# Patient Record
Sex: Female | Born: 1990 | Race: White | Hispanic: No | Marital: Single | Smoking: Current every day smoker
Health system: Southern US, Community
[De-identification: ages and names within clinical notes are randomized; demographics above are authoritative.]

## PROBLEM LIST (undated history)

## (undated) ENCOUNTER — Encounter

## (undated) ENCOUNTER — Ambulatory Visit

## (undated) ENCOUNTER — Encounter
Attending: Student in an Organized Health Care Education/Training Program | Primary: Student in an Organized Health Care Education/Training Program

## (undated) ENCOUNTER — Telehealth

## (undated) ENCOUNTER — Ambulatory Visit: Payer: PRIVATE HEALTH INSURANCE

## (undated) ENCOUNTER — Telehealth
Attending: Student in an Organized Health Care Education/Training Program | Primary: Student in an Organized Health Care Education/Training Program

## (undated) ENCOUNTER — Encounter: Attending: Radiation Oncology | Primary: Radiation Oncology

## (undated) ENCOUNTER — Non-Acute Institutional Stay: Payer: PRIVATE HEALTH INSURANCE

## (undated) ENCOUNTER — Ambulatory Visit: Payer: Medicaid (Managed Care)

## (undated) ENCOUNTER — Telehealth: Attending: Radiation Oncology | Primary: Radiation Oncology

## (undated) ENCOUNTER — Ambulatory Visit: Attending: Nurse Practitioner | Primary: Nurse Practitioner

## (undated) HISTORY — PX: BREAST ENHANCEMENT SURGERY: SHX7

---
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
## 2011-03-21 ENCOUNTER — Other Ambulatory Visit: Payer: Self-pay | Admitting: Nurse Practitioner

## 2011-03-21 ENCOUNTER — Other Ambulatory Visit (HOSPITAL_COMMUNITY)
Admission: RE | Admit: 2011-03-21 | Discharge: 2011-03-21 | Disposition: A | Discharge status: Home / Self Care | Payer: Medicaid Other | Source: Ambulatory Visit | Attending: Obstetrics and Gynecology | Admitting: Obstetrics and Gynecology

## 2011-03-21 DIAGNOSIS — Z113 Encounter for screening for infections with a predominantly sexual mode of transmission: Secondary | ICD-10-CM | POA: Insufficient documentation

## 2011-03-21 DIAGNOSIS — Z01419 Encounter for gynecological examination (general) (routine) without abnormal findings: Secondary | ICD-10-CM | POA: Insufficient documentation

## 2012-05-14 ENCOUNTER — Other Ambulatory Visit: Payer: Self-pay | Admitting: Obstetrics and Gynecology

## 2012-05-14 ENCOUNTER — Other Ambulatory Visit (HOSPITAL_COMMUNITY)
Admission: RE | Admit: 2012-05-14 | Discharge: 2012-05-14 | Disposition: A | Discharge status: Home / Self Care | Payer: Medicaid Other | Source: Ambulatory Visit | Attending: Obstetrics and Gynecology | Admitting: Obstetrics and Gynecology

## 2012-05-14 DIAGNOSIS — Z01419 Encounter for gynecological examination (general) (routine) without abnormal findings: Secondary | ICD-10-CM | POA: Insufficient documentation

## 2012-05-14 DIAGNOSIS — Z113 Encounter for screening for infections with a predominantly sexual mode of transmission: Secondary | ICD-10-CM | POA: Insufficient documentation

## 2013-03-21 ENCOUNTER — Ambulatory Visit
Admission: RE | Admit: 2013-03-21 | Discharge: 2013-03-21 | Disposition: A | Discharge status: Home / Self Care | Payer: Medicaid Other | Source: Ambulatory Visit | Attending: Family Medicine | Admitting: Family Medicine

## 2013-03-21 ENCOUNTER — Other Ambulatory Visit: Payer: Self-pay | Admitting: Family Medicine

## 2013-03-21 DIAGNOSIS — R52 Pain, unspecified: Secondary | ICD-10-CM

## 2013-05-30 ENCOUNTER — Other Ambulatory Visit: Payer: Self-pay | Admitting: Obstetrics and Gynecology

## 2013-05-30 ENCOUNTER — Other Ambulatory Visit (HOSPITAL_COMMUNITY)
Admission: RE | Admit: 2013-05-30 | Discharge: 2013-05-30 | Disposition: A | Discharge status: Home / Self Care | Payer: Medicaid Other | Source: Ambulatory Visit | Attending: Obstetrics and Gynecology | Admitting: Obstetrics and Gynecology

## 2013-05-30 DIAGNOSIS — Z113 Encounter for screening for infections with a predominantly sexual mode of transmission: Secondary | ICD-10-CM | POA: Insufficient documentation

## 2013-05-30 DIAGNOSIS — Z01419 Encounter for gynecological examination (general) (routine) without abnormal findings: Secondary | ICD-10-CM | POA: Insufficient documentation

## 2014-02-22 ENCOUNTER — Emergency Department (HOSPITAL_COMMUNITY)
Admission: EM | Admit: 2014-02-22 | Discharge: 2014-02-22 | Disposition: A | Discharge status: Home / Self Care | Payer: Medicaid Other | Attending: Emergency Medicine | Admitting: Emergency Medicine

## 2014-02-22 ENCOUNTER — Emergency Department (HOSPITAL_COMMUNITY): Payer: Medicaid Other

## 2014-02-22 ENCOUNTER — Encounter (HOSPITAL_COMMUNITY): Payer: Self-pay | Admitting: Emergency Medicine

## 2014-02-22 DIAGNOSIS — M546 Pain in thoracic spine: Secondary | ICD-10-CM | POA: Diagnosis not present

## 2014-02-22 DIAGNOSIS — Z720 Tobacco use: Secondary | ICD-10-CM | POA: Diagnosis not present

## 2014-02-22 DIAGNOSIS — R079 Chest pain, unspecified: Secondary | ICD-10-CM | POA: Diagnosis present

## 2014-02-22 DIAGNOSIS — R0789 Other chest pain: Secondary | ICD-10-CM | POA: Insufficient documentation

## 2014-02-22 DIAGNOSIS — Z793 Long term (current) use of hormonal contraceptives: Secondary | ICD-10-CM | POA: Diagnosis not present

## 2014-02-22 LAB — BASIC METABOLIC PANEL
Anion gap: 10 (ref 5–15)
BUN: 7 mg/dL (ref 6–23)
CALCIUM: 9.4 mg/dL (ref 8.4–10.5)
CHLORIDE: 110 mmol/L (ref 96–112)
CO2: 19 mmol/L (ref 19–32)
Creatinine, Ser: 0.69 mg/dL (ref 0.50–1.10)
GFR calc non Af Amer: >90 mL/min (ref >90)
GLUCOSE: 101 mg/dL — AB (ref 70–99)
POTASSIUM: 3.7 mmol/L (ref 3.5–5.1)
SODIUM: 139 mmol/L (ref 135–145)

## 2014-02-22 LAB — CBC
HEMATOCRIT: 40.6 % (ref 36.0–46.0)
HEMOGLOBIN: 14.3 g/dL (ref 12.0–15.0)
MCH: 31.9 pg (ref 26.0–34.0)
MCHC: 35.2 g/dL (ref 30.0–36.0)
MCV: 90.6 fL (ref 78.0–100.0)
Platelets: 273 10*3/uL (ref 150–400)
RBC: 4.48 MIL/uL (ref 3.87–5.11)
RDW: 12.8 % (ref 11.5–15.5)
WBC: 10 10*3/uL (ref 4.0–10.5)

## 2014-02-22 LAB — D-DIMER, QUANTITATIVE: D-Dimer, Quant: 0.33 ug/mL-FEU (ref 0.00–0.48)

## 2014-02-22 LAB — I-STAT TROPONIN, ED: Troponin i, poc: 0 ng/mL (ref 0.00–0.08)

## 2014-02-22 MED ORDER — HYDROCODONE-ACETAMINOPHEN 5-325 MG PO TABS: 1.0000 | ORAL_TABLET | ORAL | Status: DC | PRN

## 2014-02-22 MED ORDER — IBUPROFEN 800 MG PO TABS: 800.0000 mg | ORAL_TABLET | Freq: Three times a day (TID) | ORAL | Status: AC | PRN

## 2014-02-22 MED ORDER — KETOROLAC TROMETHAMINE 30 MG/ML IJ SOLN: 30.0000 mg | Freq: Once | INTRAMUSCULAR | Status: DC

## 2014-02-22 MED ORDER — MORPHINE SULFATE 4 MG/ML IJ SOLN: 4.0000 mg | Freq: Once | INTRAMUSCULAR | Status: DC

## 2014-02-22 MED ORDER — OXYCODONE-ACETAMINOPHEN 5-325 MG PO TABS
1.0000 | ORAL_TABLET | Freq: Once | ORAL | Status: AC
  Administered 2014-02-22: 1 via ORAL
  Filled 2014-02-22: qty 1

## 2014-02-22 NOTE — ED Notes (Signed)
Pt reports right sided chest pain onset Thursday night while trying ot put her son's seatbelt on. Pt skin moist to touch. Pt with shortness of breath at rest.

## 2014-02-22 NOTE — ED Notes (Signed)
"  since Thursday afternoon, I have had severe pain in right side of chest-- hurts to move, breath, and radiates to right shoulder blade area"

## 2014-02-22 NOTE — Discharge Instructions (Signed)
Read the information below.  Use the prescribed medication as directed.  Please discuss all new medications with your pharmacist.  Do not take additional tylenol while taking the prescribed pain medication to avoid overdose.  You may return to the Emergency Department at any time for worsening condition or any new symptoms that concern you.  If there is any possibility that you might be pregnant, please let your health care provider know and discuss this with the pharmacist to ensure medication safety.    You have been diagnosed by your caregiver as having chest wall pain. SEEK IMMEDIATE MEDICAL ATTENTION IF: You develop a fever.  Your chest pains become severe or intolerable.  You develop new, unexplained symptoms (problems).  You develop shortness of breath, nausea, vomiting, sweating or feel light headed.  You develop a new cough or you cough up blood.  Chest Wall Pain Chest wall pain is pain in or around the bones and muscles of your chest. It may take up to 6 weeks to get better. It may take longer if you must stay physically active in your work and activities.  CAUSES  Chest wall pain may happen on its own. However, it may be caused by:  A viral illness like the flu.  Injury.  Coughing.  Exercise.  Arthritis.  Fibromyalgia.  Shingles. HOME CARE INSTRUCTIONS   Avoid overtiring physical activity. Try not to strain or perform activities that cause pain. This includes any activities using your chest or your abdominal and side muscles, especially if heavy weights are used.  Put ice on the sore area.  Put ice in a plastic bag.  Place a towel between your skin and the bag.  Leave the ice on for 15-20 minutes per hour while awake for the first 2 days.  Only take over-the-counter or prescription medicines for pain, discomfort, or fever as directed by your caregiver. SEEK IMMEDIATE MEDICAL CARE IF:   Your pain increases, or you are very uncomfortable.  You have a  fever.  Your chest pain becomes worse.  You have new, unexplained symptoms.  You have nausea or vomiting.  You feel sweaty or lightheaded.  You have a cough with phlegm (sputum), or you cough up blood. MAKE SURE YOU:   Understand these instructions.  Will watch your condition.  Will get help right away if you are not doing well or get worse. Document Released: 12/26/2004 Document Revised: 03/20/2011 Document Reviewed: 08/22/2010 [facility] Patient Information 2015 [place]. This information is not intended to replace advice given to you by your health care provider. Make sure you discuss any questions you have with your health care provider.  Musculoskeletal Pain Musculoskeletal pain is muscle and boney aches and pains. These pains can occur in any part of the body. Your caregiver may treat you without knowing the cause of the pain. They may treat you if blood or urine tests, X-rays, and other tests were normal.  CAUSES There is often not a definite cause or reason for these pains. These pains may be caused by a type of germ (virus). The discomfort may also come from overuse. Overuse includes working out too hard when your body is not fit. Boney aches also come from weather changes. Bone is sensitive to atmospheric pressure changes. HOME CARE INSTRUCTIONS   Ask when your test results will be ready. Make sure you get your test results.  Only take over-the-counter or prescription medicines for pain, discomfort, or fever as directed by your caregiver. If you were  given medications for your condition, do not drive, operate machinery or power tools, or sign legal documents for 24 hours. Do not drink alcohol. Do not take sleeping pills or other medications that may interfere with treatment.  Continue all activities unless the activities cause more pain. When the pain lessens, slowly resume normal activities. Gradually increase the intensity and duration of the activities or  exercise.  During periods of severe pain, bed rest may be helpful. Lay or sit in any position that is comfortable.  Putting ice on the injured area.  Put ice in a bag.  Place a towel between your skin and the bag.  Leave the ice on for 15 to 20 minutes, 3 to 4 times a day.  Follow up with your caregiver for continued problems and no reason can be found for the pain. If the pain becomes worse or does not go away, it may be necessary to repeat tests or do additional testing. Your caregiver may need to look further for a possible cause. SEEK IMMEDIATE MEDICAL CARE IF:  You have pain that is getting worse and is not relieved by medications.  You develop chest pain that is associated with shortness or breath, sweating, feeling sick to your stomach (nauseous), or throw up (vomit).  Your pain becomes localized to the abdomen.  You develop any new symptoms that seem different or that concern you. MAKE SURE YOU:   Understand these instructions.  Will watch your condition.  Will get help right away if you are not doing well or get worse. Document Released: 12/26/2004 Document Revised: 03/20/2011 Document Reviewed: 08/30/2012 [facility] Patient Information 2015 [place], [place]. This information is not intended to replace advice given to you by your health care provider. Make sure you discuss any questions you have with your health care provider.

## 2014-02-22 NOTE — ED Provider Notes (Signed)
CSN: [id number]     Arrival date & time 02/22/14  7829 History   First MD Initiated Contact with Patient 02/22/14 [phone]     Chief Complaint  Patient presents with  . Chest Pain     (Consider location/radiation/quality/duration/timing/severity/associated sxs/prior Treatment) The history is provided by the patient.     Pt presents with 4 days of right sided chest and right upper back pain.  Pain is sharp and shocking, constant, exacerbated by movement, lifting right arm, and deep inspiration.  Denies fevers, URI symptoms, SOB, abdominal pain, N/V.  Denies heavy lifting or injury.  Denies recent immobilization, leg swelling.  She is on depo provera.  Has family hx blood clot "that caused a stroke" in patient's paternal aunt.  Has taken  ibuprofen without significant relief.   History reviewed. No pertinent past medical history. Past Surgical History  Procedure Laterality Date  . Breast enhancement surgery     No family history on file. History  Substance Use Topics  . Smoking status: Current Every Day Smoker  . Smokeless tobacco: Not on file  . Alcohol Use: Yes   OB History    No data available     Review of Systems  All other systems reviewed and are negative.     Allergies  Review of patient's allergies indicates no known allergies.  Home Medications   Prior to Admission medications   Medication Sig Start Date End Date Taking? Authorizing Provider  ibuprofen (ADVIL,MOTRIN) 600 MG tablet Take 600 mg by mouth every 6 (six) hours as needed for mild pain or moderate pain.   Yes Historical Provider, MD  medroxyPROGESTERone (DEPO-PROVERA) 150 MG/ML injection Inject 150 mg into the muscle every 3 (three) months.   Yes Historical Provider, MD   BP 133/66 mmHg  Pulse 74  Temp(Src) 98.1 F (36.7 C) (Oral)  Resp 22  Ht  (1.575 m)  Wt 165 lb (74.844 kg)  BMI 30.17 kg/m2  SpO2 99% Physical Exam  Constitutional: She appears well-developed and well-nourished. No  distress.  HENT:  Head: Normocephalic and atraumatic.  Neck: Neck supple.  Cardiovascular: Normal rate, regular rhythm and intact distal pulses.   Pulmonary/Chest: Effort normal and breath sounds normal. No respiratory distress. She has no wheezes. She has no rales.   She exhibits tenderness.    Abdominal: Soft. She exhibits no distension. There is no tenderness. There is no rebound, no guarding and negative Murphy's sign.  Musculoskeletal: She exhibits no edema.  Upper extremities:  Strength 5/5, sensation intact, distal pulses intact.     Neurological: She is alert.  Skin: She is not diaphoretic.  Nursing note and vitals reviewed.   ED Course  Procedures (including critical care time) Labs Review Labs Reviewed  BASIC METABOLIC PANEL - Abnormal; Notable for the following:    Glucose, Bld 101 (*)    All other components within normal limits  CBC  D-DIMER, QUANTITATIVE  I-STAT TROPOININ, ED    Imaging Review Dg Chest 2 View (if Patient Has Fever And/or Copd)  02/22/2014   CLINICAL DATA:  Right upper chest pain and shortness of breath for 3 days.  EXAM: CHEST  2 VIEW  COMPARISON:  None.  FINDINGS: The heart size and mediastinal contours are within normal limits. Both lungs are clear. The visualized skeletal structures are unremarkable.  IMPRESSION: No active cardiopulmonary disease.   Electronically Signed   By: Annia Belt M.D.   On: 02/22/2014 10:18     EKG Interpretation  Date/Time:  Sunday February 22 2014 08:39:03 EST Ventricular Rate:  80 PR Interval:  134 QRS Duration: 100 QT Interval:  388 QTC Calculation: 447 R Axis:   84 Text Interpretation:  Normal sinus rhythm Normal ECG No old tracing to  compare Confirmed by [facility] HospitalWOFFORD  MD, TREY ([id number]) on 02/22/2014 8:59:24 AM      MDM   Final diagnoses:  Chest wall pain    Afebrile, nontoxic patient with right chest wall and right upper back pain, tender to palpation, worse with movement and deep inspiration.  No  fevers, no SOB.  CXR negative.  D-Dimer negative.  PERC negative.  Wells' criteria for PE is zero.  Pt feeling better after pain medication. Suspect musculoskeletal chest wall pain.  D/C home with ibuprofen, norco, PCP follow up.  Discussed result, findings, treatment, and follow up  with patient.  Pt given return precautions.  Pt verbalizes understanding and agrees with plan.         Trixie Dredgemily Cinda Hara, PA-C 02/22/14 1511  Candyce ChurnJohn David Wofford III, MD 02/22/14 [phone]

## 2014-04-16 ENCOUNTER — Telehealth: Payer: Self-pay | Admitting: *Deleted

## 2014-04-16 ENCOUNTER — Encounter (INDEPENDENT_AMBULATORY_CARE_PROVIDER_SITE_OTHER): Payer: Medicaid Other

## 2014-04-16 ENCOUNTER — Encounter: Payer: Self-pay | Admitting: *Deleted

## 2014-04-16 DIAGNOSIS — R000 Tachycardia, unspecified: Secondary | ICD-10-CM | POA: Diagnosis not present

## 2014-04-16 NOTE — Telephone Encounter (Signed)
24 Hr Holter Monitor ordered.

## 2014-04-16 NOTE — Progress Notes (Signed)
Patient ID: Angela Hawkins, female   DOB: 01/01/1991, 24 y.o.   MRN: [id number] [employer] 24 hour holter monitor applied to patient.

## 2014-04-21 ENCOUNTER — Other Ambulatory Visit: Payer: Self-pay

## 2014-04-21 DIAGNOSIS — R000 Tachycardia, unspecified: Secondary | ICD-10-CM

## 2015-08-23 IMAGING — CR DG CHEST 2V
2 series · 2 of 2 positions shown · non-contrast
Comparison: None.

CLINICAL DATA: Right upper chest pain and shortness of breath for 3
days.

EXAM:
CHEST  2 VIEW

[chest pa]
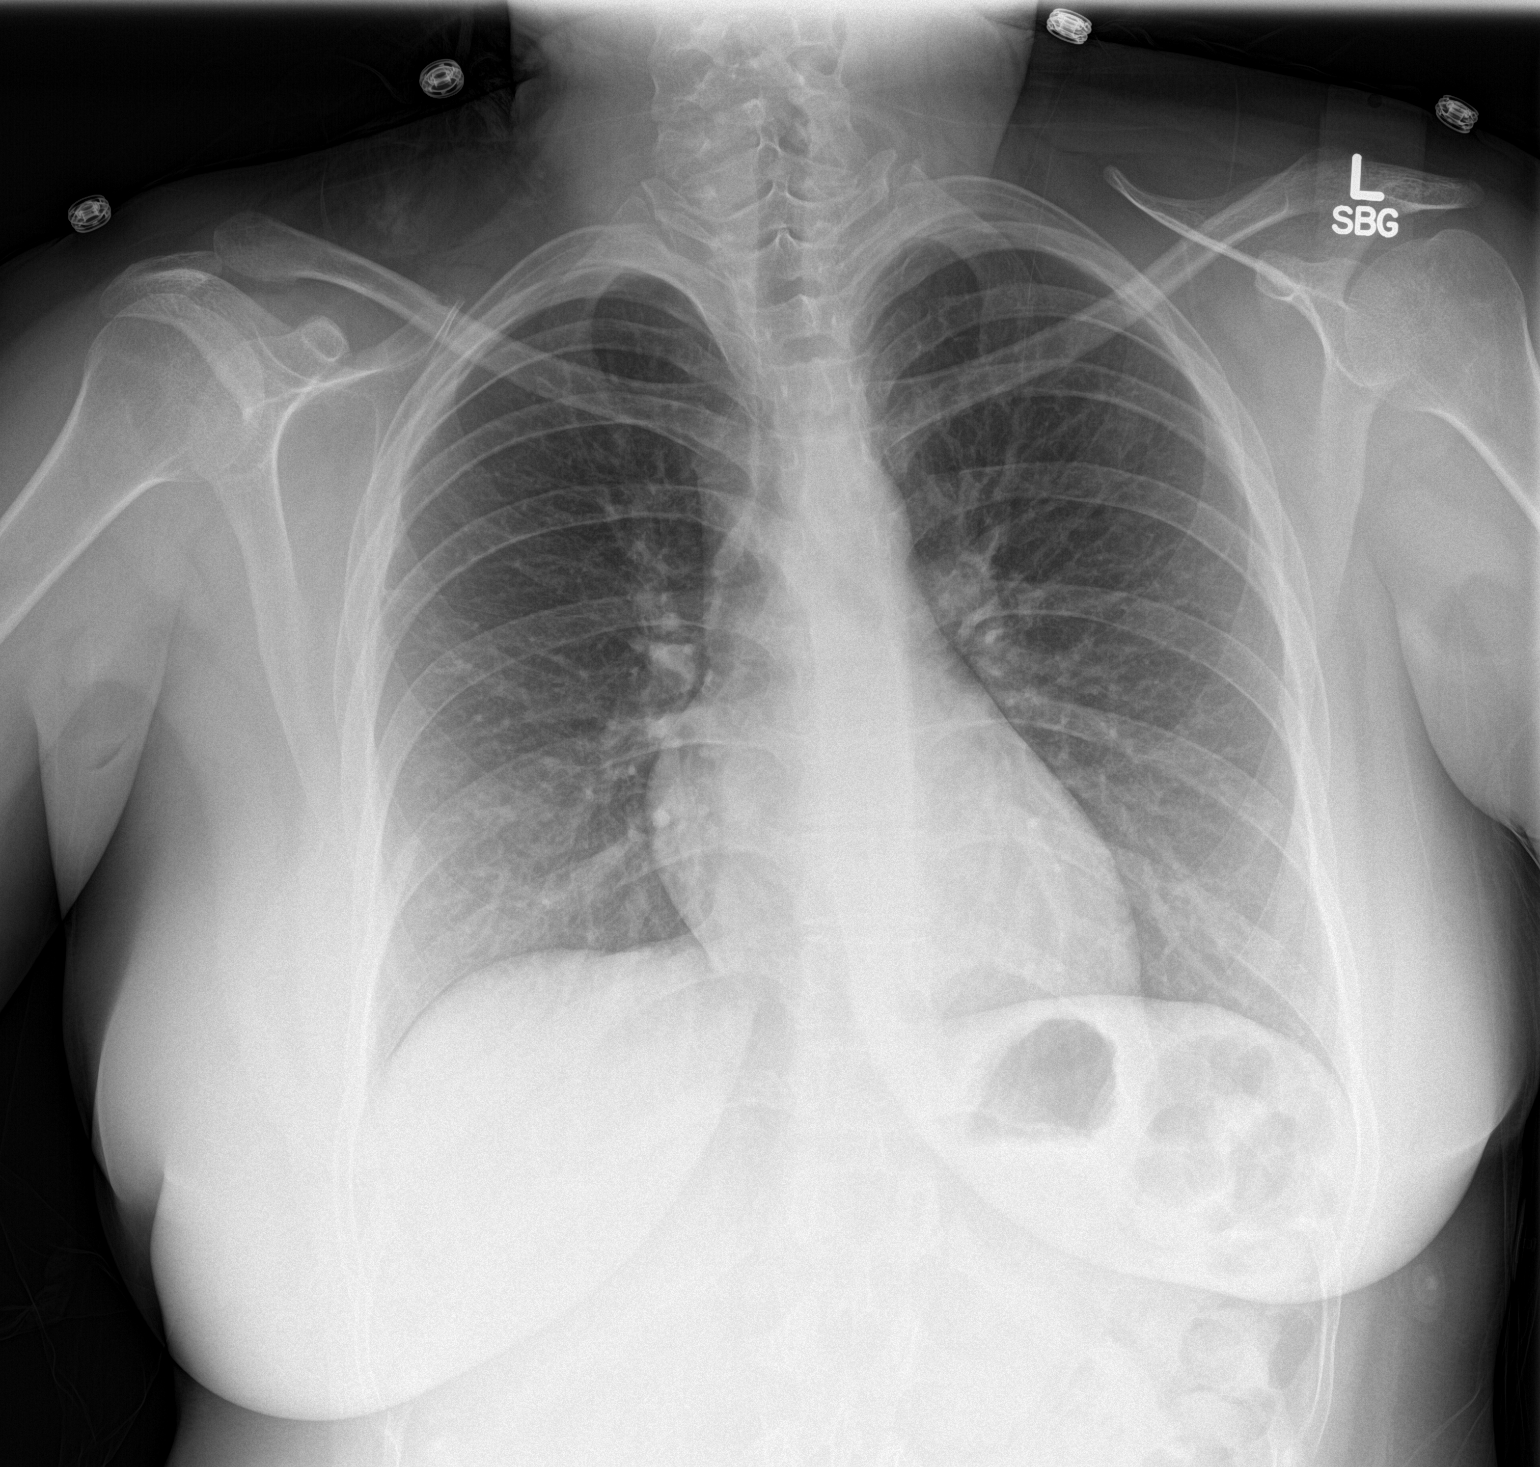

[chest lat]
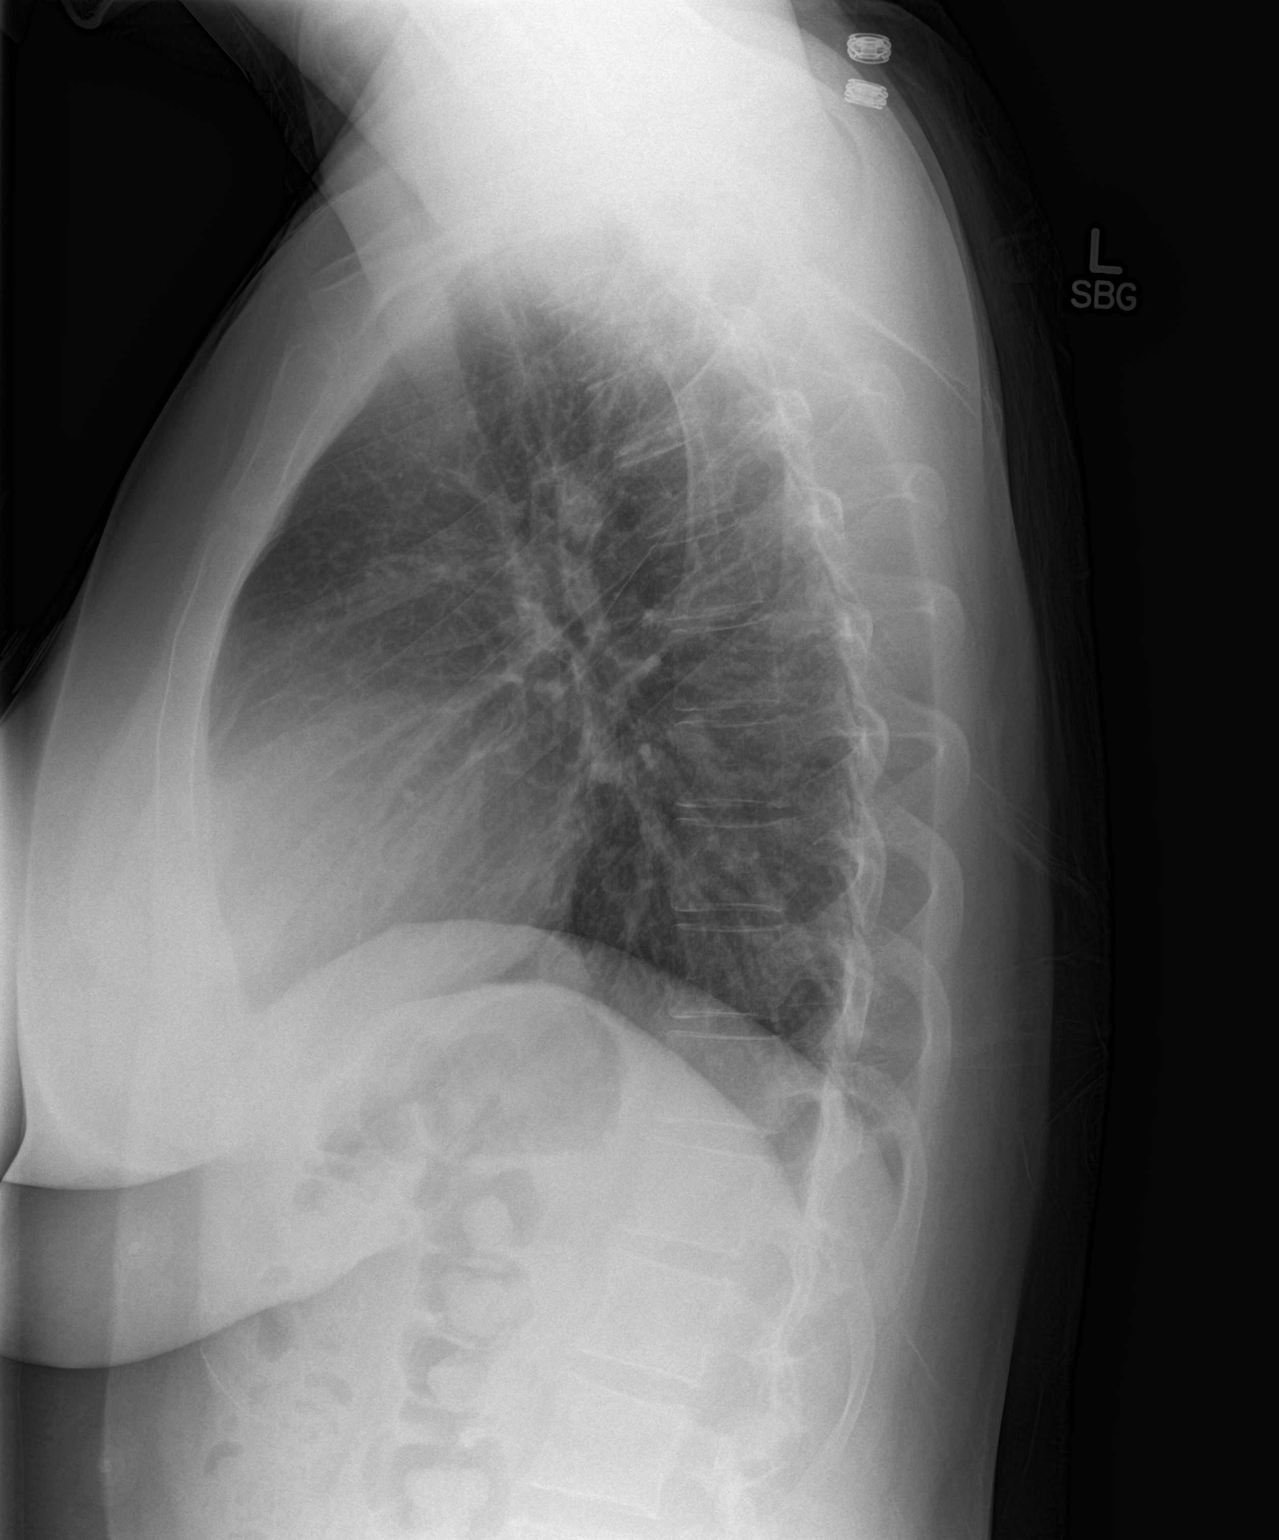

[2 of 2 positions shown; findings below may reference images not displayed]

FINDINGS: The heart size and mediastinal contours are within normal limits.
Both lungs are clear. The visualized skeletal structures are
unremarkable.
IMPRESSION: No active cardiopulmonary disease.

## 2015-11-17 ENCOUNTER — Other Ambulatory Visit: Payer: Self-pay | Admitting: Physician Assistant

## 2015-11-17 ENCOUNTER — Other Ambulatory Visit (HOSPITAL_COMMUNITY)
Admission: RE | Admit: 2015-11-17 | Discharge: 2015-11-17 | Disposition: A | Discharge status: Home / Self Care | Payer: Medicaid Other | Source: Ambulatory Visit | Attending: Physician Assistant | Admitting: Physician Assistant

## 2015-11-17 DIAGNOSIS — Z1151 Encounter for screening for human papillomavirus (HPV): Secondary | ICD-10-CM | POA: Insufficient documentation

## 2015-11-17 DIAGNOSIS — Z124 Encounter for screening for malignant neoplasm of cervix: Secondary | ICD-10-CM | POA: Diagnosis present

## 2015-11-23 LAB — CYTOLOGY - PAP
Adequacy: ABSENT — AB
HPV (WINDOPATH): DETECTED — AB

## 2016-06-06 ENCOUNTER — Other Ambulatory Visit: Payer: Self-pay | Admitting: Physician Assistant

## 2016-06-06 ENCOUNTER — Other Ambulatory Visit (HOSPITAL_COMMUNITY)
Admission: RE | Admit: 2016-06-06 | Discharge: 2016-06-06 | Disposition: A | Discharge status: Home / Self Care | Payer: Medicaid Other | Source: Ambulatory Visit | Attending: Family Medicine | Admitting: Family Medicine

## 2016-06-06 DIAGNOSIS — R87612 Low grade squamous intraepithelial lesion on cytologic smear of cervix (LGSIL): Secondary | ICD-10-CM | POA: Insufficient documentation

## 2016-06-08 LAB — CYTOLOGY - PAP
Adequacy: ABSENT
Diagnosis: NEGATIVE

## 2016-11-24 ENCOUNTER — Other Ambulatory Visit (HOSPITAL_COMMUNITY)
Admission: RE | Admit: 2016-11-24 | Discharge: 2016-11-24 | Disposition: A | Discharge status: Home / Self Care | Payer: Medicaid Other | Source: Ambulatory Visit | Attending: Family Medicine | Admitting: Family Medicine

## 2016-11-24 ENCOUNTER — Other Ambulatory Visit: Payer: Self-pay | Admitting: Physician Assistant

## 2016-11-24 DIAGNOSIS — Z01419 Encounter for gynecological examination (general) (routine) without abnormal findings: Secondary | ICD-10-CM | POA: Insufficient documentation

## 2016-11-28 LAB — CYTOLOGY - PAP
Adequacy: ABSENT
Diagnosis: NEGATIVE

## 2017-06-05 ENCOUNTER — Ambulatory Visit: Admit: 2017-06-05 | Discharge: 2017-06-06 | Payer: PRIVATE HEALTH INSURANCE

## 2017-06-05 DIAGNOSIS — L732 Hidradenitis suppurativa: Principal | ICD-10-CM

## 2017-06-05 DIAGNOSIS — K130 Diseases of lips: Secondary | ICD-10-CM

## 2017-06-05 MED ORDER — SPIRONOLACTONE 50 MG TABLET
ORAL_TABLET | 2 refills | 0 days | Status: CP
Start: 2017-06-05 — End: 2017-08-13

## 2017-06-05 MED ORDER — HYDROCORTISONE 2.5 % TOPICAL OINTMENT
0 refills | 0 days | Status: CP
Start: 2017-06-05 — End: 2018-09-09

## 2017-08-13 ENCOUNTER — Ambulatory Visit: Admit: 2017-08-13 | Discharge: 2017-08-14 | Payer: PRIVATE HEALTH INSURANCE

## 2017-08-13 DIAGNOSIS — L732 Hidradenitis suppurativa: Secondary | ICD-10-CM | POA: Insufficient documentation

## 2017-08-13 DIAGNOSIS — L910 Hypertrophic scar: Secondary | ICD-10-CM

## 2017-08-13 DIAGNOSIS — K130 Diseases of lips: Secondary | ICD-10-CM

## 2017-08-13 DIAGNOSIS — Z79899 Other long term (current) drug therapy: Secondary | ICD-10-CM

## 2017-08-13 MED ORDER — SPIRONOLACTONE 50 MG TABLET
ORAL_TABLET | 2 refills | 0 days | Status: CP
Start: 2017-08-13 — End: 2018-05-07

## 2017-08-13 MED ORDER — METRONIDAZOLE 500 MG TABLET
ORAL_TABLET | Freq: Three times a day (TID) | ORAL | 2 refills | 0.00000 days | Status: CP
Start: 2017-08-13 — End: 2017-11-11

## 2017-08-13 MED ORDER — CEFDINIR 300 MG CAPSULE
ORAL_CAPSULE | 2 refills | 0 days | Status: CP
Start: 2017-08-13 — End: 2018-09-09

## 2017-08-13 MED ORDER — KETOCONAZOLE 2 % TOPICAL CREAM
Freq: Every day | TOPICAL | 11 refills | 0 days | Status: CP
Start: 2017-08-13 — End: 2017-11-04

## 2017-08-21 MED ORDER — NYSTATIN 100,000 UNIT/GRAM TOPICAL POWDER
11 refills | 0 days | Status: CP
Start: 2017-08-21 — End: ?

## 2017-08-22 ENCOUNTER — Ambulatory Visit: Admit: 2017-08-22 | Discharge: 2017-08-23 | Payer: PRIVATE HEALTH INSURANCE

## 2017-08-22 DIAGNOSIS — L732 Hidradenitis suppurativa: Principal | ICD-10-CM

## 2017-09-05 ENCOUNTER — Encounter: Admit: 2017-09-05 | Discharge: 2017-09-06 | Payer: PRIVATE HEALTH INSURANCE

## 2017-09-05 DIAGNOSIS — L732 Hidradenitis suppurativa: Principal | ICD-10-CM

## 2017-10-03 ENCOUNTER — Encounter: Admit: 2017-10-03 | Discharge: 2017-10-04 | Payer: PRIVATE HEALTH INSURANCE

## 2017-10-03 DIAGNOSIS — L732 Hidradenitis suppurativa: Principal | ICD-10-CM

## 2017-10-15 ENCOUNTER — Ambulatory Visit: Admit: 2017-10-15 | Discharge: 2017-10-16 | Payer: PRIVATE HEALTH INSURANCE

## 2017-10-15 DIAGNOSIS — L910 Hypertrophic scar: Secondary | ICD-10-CM

## 2017-10-15 DIAGNOSIS — Z79899 Other long term (current) drug therapy: Secondary | ICD-10-CM

## 2017-10-15 DIAGNOSIS — L732 Hidradenitis suppurativa: Principal | ICD-10-CM

## 2017-10-15 MED ORDER — CLOBETASOL 0.05 % TOPICAL OINTMENT
3 refills | 0 days | Status: CP
Start: 2017-10-15 — End: 2017-11-04

## 2017-11-05 MED ORDER — KETOCONAZOLE 2 % TOPICAL CREAM
Freq: Every day | TOPICAL | 11 refills | 0.00000 days | Status: CP
Start: 2017-11-05 — End: 2017-11-19

## 2017-11-05 MED ORDER — CLOBETASOL 0.05 % TOPICAL OINTMENT
3 refills | 0 days | Status: CP
Start: 2017-11-05 — End: ?

## 2017-11-20 MED ORDER — KETOCONAZOLE 2 % TOPICAL CREAM
Freq: Every day | TOPICAL | 11 refills | 0.00000 days | Status: CP
Start: 2017-11-20 — End: 2018-01-24

## 2017-11-28 ENCOUNTER — Encounter: Admit: 2017-11-28 | Discharge: 2017-11-29 | Payer: PRIVATE HEALTH INSURANCE

## 2017-11-28 DIAGNOSIS — L732 Hidradenitis suppurativa: Principal | ICD-10-CM

## 2018-01-14 ENCOUNTER — Encounter: Admit: 2018-01-14 | Discharge: 2018-01-15 | Payer: PRIVATE HEALTH INSURANCE

## 2018-01-14 DIAGNOSIS — L732 Hidradenitis suppurativa: Principal | ICD-10-CM

## 2018-01-14 MED ORDER — FOLIC ACID 1 MG TABLET
ORAL_TABLET | Freq: Every day | ORAL | 3 refills | 0 days | Status: CP
Start: 2018-01-14 — End: 2019-01-14

## 2018-01-14 MED ORDER — METHOTREXATE SODIUM 2.5 MG TABLET
ORAL_TABLET | 2 refills | 0 days | Status: CP
Start: 2018-01-14 — End: 2018-05-07

## 2018-01-23 ENCOUNTER — Encounter: Admit: 2018-01-23 | Discharge: 2018-01-24 | Payer: PRIVATE HEALTH INSURANCE

## 2018-01-23 DIAGNOSIS — L732 Hidradenitis suppurativa: Principal | ICD-10-CM

## 2018-01-25 MED ORDER — KETOCONAZOLE 2 % TOPICAL CREAM
Freq: Every day | TOPICAL | 11 refills | 0.00000 days | Status: CP
Start: 2018-01-25 — End: 2018-02-25

## 2018-02-20 ENCOUNTER — Encounter: Admit: 2018-02-20 | Discharge: 2018-02-20 | Payer: PRIVATE HEALTH INSURANCE

## 2018-02-20 DIAGNOSIS — L732 Hidradenitis suppurativa: Principal | ICD-10-CM

## 2018-02-26 MED ORDER — KETOCONAZOLE 2 % TOPICAL CREAM
Freq: Every day | TOPICAL | 11 refills | 0 days | Status: CP
Start: 2018-02-26 — End: 2018-04-14

## 2018-03-20 ENCOUNTER — Encounter: Admit: 2018-03-20 | Discharge: 2018-03-21 | Payer: PRIVATE HEALTH INSURANCE

## 2018-03-20 DIAGNOSIS — L732 Hidradenitis suppurativa: Principal | ICD-10-CM

## 2018-04-15 MED ORDER — KETOCONAZOLE 2 % TOPICAL CREAM
Freq: Every day | TOPICAL | 11 refills | 0 days | Status: CP
Start: 2018-04-15 — End: 2019-04-15

## 2018-04-17 ENCOUNTER — Ambulatory Visit: Admit: 2018-04-17 | Discharge: 2018-04-18 | Payer: PRIVATE HEALTH INSURANCE

## 2018-04-17 DIAGNOSIS — L732 Hidradenitis suppurativa: Principal | ICD-10-CM

## 2018-05-07 ENCOUNTER — Encounter: Admit: 2018-05-07 | Discharge: 2018-05-08 | Payer: PRIVATE HEALTH INSURANCE

## 2018-05-07 DIAGNOSIS — L732 Hidradenitis suppurativa: Principal | ICD-10-CM

## 2018-05-07 MED ORDER — SPIRONOLACTONE 50 MG TABLET
ORAL_TABLET | Freq: Every day | ORAL | 2 refills | 0.00000 days | Status: CP
Start: 2018-05-07 — End: ?

## 2018-05-07 MED ORDER — CLINDAMYCIN HCL 300 MG CAPSULE
ORAL_CAPSULE | Freq: Three times a day (TID) | ORAL | 0 refills | 0.00000 days | Status: CP
Start: 2018-05-07 — End: 2018-06-18

## 2018-05-07 MED ORDER — METHOTREXATE SODIUM 2.5 MG TABLET
ORAL_TABLET | 2 refills | 0.00000 days | Status: CP
Start: 2018-05-07 — End: ?

## 2018-05-15 ENCOUNTER — Encounter: Admit: 2018-05-15 | Discharge: 2018-05-16 | Payer: PRIVATE HEALTH INSURANCE

## 2018-05-15 DIAGNOSIS — L732 Hidradenitis suppurativa: Principal | ICD-10-CM

## 2018-05-29 ENCOUNTER — Encounter: Admit: 2018-05-29 | Discharge: 2018-05-30 | Payer: PRIVATE HEALTH INSURANCE

## 2018-05-29 DIAGNOSIS — L732 Hidradenitis suppurativa: Principal | ICD-10-CM

## 2018-05-31 MED FILL — medroxyPROGESTERone ACETATE: 150 | 90 days supply | Qty: 1 | Fill #0

## 2018-06-19 MED ORDER — CLINDAMYCIN HCL 300 MG CAPSULE
ORAL_CAPSULE | Freq: Three times a day (TID) | ORAL | 0 refills | 0.00000 days | Status: CP
Start: 2018-06-19 — End: 2018-09-09

## 2018-06-26 ENCOUNTER — Ambulatory Visit: Admit: 2018-06-26 | Discharge: 2018-06-27 | Payer: MEDICAID

## 2018-06-26 DIAGNOSIS — L732 Hidradenitis suppurativa: Principal | ICD-10-CM

## 2018-07-24 ENCOUNTER — Encounter: Admit: 2018-07-24 | Discharge: 2018-07-24 | Payer: PRIVATE HEALTH INSURANCE

## 2018-07-24 DIAGNOSIS — L732 Hidradenitis suppurativa: Principal | ICD-10-CM

## 2018-09-09 ENCOUNTER — Encounter: Admit: 2018-09-09 | Discharge: 2018-09-10 | Payer: PRIVATE HEALTH INSURANCE

## 2018-09-09 DIAGNOSIS — L732 Hidradenitis suppurativa: Principal | ICD-10-CM

## 2018-09-09 DIAGNOSIS — Z79899 Other long term (current) drug therapy: Secondary | ICD-10-CM

## 2018-09-09 DIAGNOSIS — L910 Hypertrophic scar: Secondary | ICD-10-CM

## 2018-09-09 MED ORDER — RISANKIZUMAB-RZAA 150 MG/1.66 ML (75 MG/0.83ML X 2) SUBCUT SYRINGE KIT
11 refills | 0 days | Status: CP
Start: 2018-09-09 — End: ?
  Filled 2018-09-18: qty 2, 28d supply, fill #0

## 2018-09-09 MED ORDER — CEFDINIR 300 MG CAPSULE
ORAL_CAPSULE | Freq: Two times a day (BID) | ORAL | 1 refills | 30.00000 days | Status: CP
Start: 2018-09-09 — End: ?

## 2018-09-09 MED ORDER — METRONIDAZOLE 500 MG TABLET
ORAL_TABLET | Freq: Three times a day (TID) | ORAL | 1 refills | 30.00000 days | Status: CP
Start: 2018-09-09 — End: ?

## 2018-09-10 DIAGNOSIS — L732 Hidradenitis suppurativa: Secondary | ICD-10-CM

## 2018-09-18 MED ORDER — EMPTY CONTAINER
2 refills | 0 days
Start: 2018-09-18 — End: ?

## 2018-09-18 MED FILL — SKYRIZI 150 MG/1.66 ML(75 MG/0.83 ML X 2) SUBCUTANEOUS SYRINGE KIT: 28 days supply | Qty: 2 | Fill #0 | Status: AC

## 2018-09-18 MED FILL — EMPTY CONTAINER: 120 days supply | Qty: 1 | Fill #0

## 2018-09-18 MED FILL — EMPTY CONTAINER: 120 days supply | Qty: 1 | Fill #0 | Status: AC

## 2018-09-18 NOTE — Unmapped (Signed)
[facility]   Patient Onboarding/Medication Counseling    Debbie Gregory is a 28 y.o. female with hidradentitis who I am counseling today on initiation of therapy.  I am speaking to the patient.    Verified patient's date of birth / HIPAA.    Specialty medication(s) to be sent: Inflammatory Disorders: Skyrizi      Non-specialty medications/supplies to be sent: sharps kit      Medications not needed at this time: na         Skyrizi (risankizumab)    Medication & Administration     Dosage: HS (off label) dosing - inject 4 syringes (300 mg) under the skin every 4 weeks.    Lab tests required prior to treatment initiation:  ? Tuberculosis: Tuberculosis screening resulted in a non-reactive Quantiferon TB Gold assay.    Administration:     Prefilled syringe  1. Gather all supplies needed for injection on a clean, flat working surface: medication syringe(s) removed from packaging, alcohol swab, sharps container, etc.  2. Look at the medication label ??? look for correct medication, correct dose, and check the expiration date  3. Look at the medication ??? the liquid in the syringe should appear clear and colorless to slightly yellow, you may see tiny white or clear particles  4. Lay the syringe on a flat surface and allow it to warm up to room temperature for at least 30 minutes  5. Select injection site ??? you can use the front of your thigh or your belly (but not the area 2 inches around your belly button); if someone else is giving you the injection you can also use your upper arm in the skin covering your triceps muscle  6. Prepare injection site ??? wash your hands and clean the skin at the injection site with an alcohol swab and let it air dry, do not touch the injection site again before the injection  7. Pull off the needle safety cap, do not remove until immediately prior to injection; turn the syringe so the needle is facing up and hold the syringe at eye level with one hand so you can see the air in the syringe; using your other hand, slowly push the plunger in to push the air out through the needle  8. Pinch the skin ??? with your hand not holding the syringe pinch up a fold of skin at the injection site using your forefinger and thumb  9. Insert the needle into the fold of skin at about a 45 degree angle ??? it's best to use a quick dart-like motion  10. Push the plunger down slowly as far as it will go until the syringe is empty, if the plunger is not fully depressed the needle shield will not extend to cover the needle when it is removed, hold the syringe in place for a full 5 seconds  11. Check that the syringe is empty and keep pressing down on the plunger while you pull the needle out at the same angle as inserted; after the needle is removed completely from the skin, release the plunger allowing the needle shield to activate and cover the used needle  12. Dispose of the used syringe immediately in your sharps disposal container, do not attempt to recap the needle prior to disposing  13. If you see any blood at the injection site, press a cotton ball or gauze on the site and maintain pressure until the bleeding stops, do not rub the injection site  Adherence/Missed dose instructions:  If your injection is given more than 7 days after your scheduled injection date ??? consult your pharmacist for additional instructions on how to adjust your dosing schedule.    Goals of Therapy     ? Minimize areas of skin involvement (% BSA)  ? Avoidance of long term glucocorticoid use  ? Maintenance of effective psychosocial functioning      Side Effects & Monitoring Parameters     ? Injection site reaction (redness, irritation, inflammation localized to the site of administration)  ? Signs of a common cold ??? minor sore throat, runny or stuffy nose, etc.  ? Felling tired/weak  ? Headache    The following side effects should be reported to the provider:  ? Signs of a hypersensitivity reaction ??? rash; hives; itching; red, swollen, blistered, or peeling skin; wheezing; tightness in the chest or throat; difficulty breathing, swallowing, or talking; swelling of the mouth, face, lips, tongue, or throat; etc.  ? Reduced immune function ??? report signs of infection such as fever; chills; body aches; very bad sore throat; ear or sinus pain; cough; more sputum or change in color of sputum; pain with passing urine; wound that will not heal, etc.  Also at a slightly higher risk of some malignancies (mainly skin and blood cancers) due to this reduced immune function.  o In the case of signs of infection ??? the patient should hold the next dose of Skyrizi?? and call your primary care provider to ensure adequate medical care.  Treatment may be resumed when infection is treated and patient is asymptomatic.  ? Flu-like symptoms  ? Warm, red, or painful skin or sores on the body      Contraindications, Warnings, & Precautions     ? Have your bloodwork checked as you have been told by your prescriber  ? Talk with your doctor if you are pregnant, planning to become pregnant, or breastfeeding  ? Discuss the possible need for holding your dose(s) of Skyrizi?? when a planned procedure is scheduled with the prescriber as it may delay healing/recovery timeline       Drug/Food Interactions     ? Medication list reviewed in Epic. The patient was instructed to inform the care team before taking any new medications or supplements. No drug interactions identified.   ? Talk with you prescriber or pharmacist before receiving any live vaccinations while taking this medication and after you stop taking it    Storage, Handling Precautions, & Disposal     ? Store this medication in the refrigerator.  Do not freeze  ? If needed, you may store at room temperature for up to 24 hours  ? Store in [employer], protected from light  ? Do not shake  ? Dispose of used syringes/pens in a sharps disposal container            Current Medications (including OTC/herbals), Comorbidities and Allergies     Current Outpatient Medications   Medication Sig Dispense Refill   ??? cefdinir (OMNICEF) 300 MG capsule Take 1 capsule (300 mg total) by mouth Two (2) times a day. 60 capsule 1   ??? clobetasol (TEMOVATE) 0.05 % ointment Appy twice daily to red areas as needed. 60 g 3   ??? empty container Misc Use as directed to dispose of [employer]. 1 each 2   ??? folic acid (FOLVITE) 1 MG tablet Take 1 tablet (1 mg total) by mouth daily. 100 tablet 3   ??? ketoconazole (  NIZORAL) 2 % cream Apply 1 application topically daily. Three times daily in corners of mouth 15 g 11   ??? medroxyPROGESTERone (DEPO-PROVERA) 150 mg/mL injection Inject into the muscle.     ??? methotrexate 2.5 MG tablet Take 6 tablets (15mg ) on one day each week 30 tablet 2   ??? metroNIDAZOLE (FLAGYL) 500 MG tablet Take 1 tablet (500 mg total) by mouth Three (3) times a day. 90 tablet 1   ??? nystatin (MYCOSTATIN) 100,000 unit/gram powder Apply twice daily as needed 60 g 11   ??? propranolol (INDERAL) 20 MG tablet TAKE 1 TABLET BY MOUTH EVERY DAY     ??? risankizumab-rzaa (SKYRIZI) 150mg /1.32mL(75 mg/0.83 mL x2) SyKt Inject the contents of 4 syringes (300mg ) under the skin every 4 weeks. 2 each 11   ??? spironolactone (ALDACTONE) 50 MG tablet Take 0.5 tablets (25 mg total) by mouth daily. 30 tablet 2     No current facility-administered medications for this visit.        No Known Allergies    Patient Active Problem List   Diagnosis   ??? Hidradenitis suppurativa       Reviewed and up to date in Epic.    Appropriateness of Therapy     Is medication and dose appropriate based on diagnosis? No - evidence provided by prescriber in 09/09/18 note    Baseline Quality of Life Assessment      How many days over the past month did your HS keep you from your normal activities? 0    Financial Information     Medication Assistance provided: Prior Authorization    Anticipated copay of $3 reviewed with patient. Verified delivery address.    Delivery Information Scheduled delivery date: Thus, Sept 10    Expected start date: Thurs, Sept 10      Medication will be delivered via UPS to the home address in [place].  This shipment will not require a signature.      Explained the services we provide at [facility] and that each month we would call to set up refills.  Stressed importance of returning phone calls so that we could ensure they receive their medications in time each month.  Informed patient that we should be setting up refills 7-10 days prior to when they will run out of medication.  A pharmacist will reach out to perform a clinical assessment periodically.  Informed patient that a welcome packet and a drug information handout will be sent.      Patient verbalized understanding of the above information as well as how to contact the pharmacy at [phone] option 4 with any questions/concerns.  The pharmacy is open Monday through Friday 8:30am-4:30pm.  A pharmacist is available 24/7 via pager to answer any clinical questions they may have.    Patient Specific Needs     ? Does the patient have any physical, cognitive, or cultural barriers? No    ? Patient prefers to have medications discussed with  Patient     ? Is the patient able to read and understand education materials at a high school level or above? No    ? Patient's primary language is  English     ? Is the patient high risk? No     ? Does the patient require a Care Management Plan? No     ? Does the patient require physician intervention or other additional services (i.e. nutrition, smoking cessation, social work)? No  Lanney GinsMeghan A Rickey Sadowski  [facility] Specialty Pharmacist

## 2018-09-18 NOTE — Unmapped (Signed)
**  REFERRAL NEVER RECEIVED BACK TO [facility], PATIENT CALLED TO INQUIRE ABOUT MEDICATION BEING SENT**    [facility] Specialty Medication Referral: PA Approved      Medication (Brand/Generic): [facility]    Final Test Claim completed with resulted information below:    Patient ABLE to fill at [facility]:  [place] MEDICAID  Anticipated Copay: $3  Is anticipated copay with a copay card or grant? No, there is no need for grant or copay assistance.     Does this patient have to receive a partial fill of the medication due to insurance restrictions? NO  If so, please cofirm how many days supply is allowed per plan per fill and how long the patient will have to fill partial months supply for the medication: NOT APPLICABLE     If the copay is under the $25 defined limit, per policy there will be no further investigation of need for financial assistance at this time unless patient requests. This referral has been communicated to the provider and handed off to the [facility] team for further processing and filling of prescribed medication.   ______________________________________________________________________  Please utilize this referral for viewing purposes as it will serve as the central location for all relevant documentation and updates.

## 2018-10-09 NOTE — Unmapped (Signed)
Teanna started her Cristy Folks injections earlier this month. She unfortunately has had some HS flares, including in new areas previously unaffected.     She additionally has developed a rash over the past 2 weeks (starting ~ 1 week after [employer]). It's itchy, and predominately on her arms/legs. She describes it as red spots - the size of a bobby pin head - with a white head in the center that will pop/dry after a few days. She's been using calamine spray on it to some relief, but she does report the itching from it keeps her up at night. No involvement around mouth/nose/vagina.    I advised she could try topical steroid on a small area to see if she gets improvement. I advised to stop using if it worsens, as it could mean it's infectious.    I'll tag dermatology to get their thoughts.     [facility] Clinical Assessment & Refill Coordination Note    MARRI MCNEFF, DOB: 08-Oct-1990  Phone: [phone] (home)     All above HIPAA information was verified with patient.     Specialty Medication(s):   Inflammatory Disorders: Skyrizi     Current Outpatient Medications   Medication Sig Dispense Refill   ??? cefdinir (OMNICEF) 300 MG capsule Take 1 capsule (300 mg total) by mouth Two (2) times a day. 60 capsule 1   ??? clobetasol (TEMOVATE) 0.05 % ointment Appy twice daily to red areas as needed. 60 g 3   ??? empty container Misc Use as directed to dispose of [employer]. 1 each 2   ??? folic acid (FOLVITE) 1 MG tablet Take 1 tablet (1 mg total) by mouth daily. 100 tablet 3   ??? ketoconazole (NIZORAL) 2 % cream Apply 1 application topically daily. Three times daily in corners of mouth 15 g 11   ??? medroxyPROGESTERone (DEPO-PROVERA) 150 mg/mL injection Inject into the muscle.     ??? methotrexate 2.5 MG tablet Take 6 tablets (15mg ) on one day each week 30 tablet 2   ??? metroNIDAZOLE (FLAGYL) 500 MG tablet Take 1 tablet (500 mg total) by mouth Three (3) times a day. 90 tablet 1   ??? nystatin (MYCOSTATIN) 100,000 unit/gram powder Apply twice daily as needed 60 g 11   ??? propranolol (INDERAL) 20 MG tablet TAKE 1 TABLET BY MOUTH EVERY DAY     ??? risankizumab-rzaa (SKYRIZI) 150mg /1.44mL(75 mg/0.83 mL x2) SyKt Inject the contents of 4 syringes (300mg ) under the skin every 4 weeks. 2 each 11   ??? spironolactone (ALDACTONE) 50 MG tablet Take 0.5 tablets (25 mg total) by mouth daily. 30 tablet 2     No current facility-administered medications for this visit.         Changes to medications: Staphany reports no changes at this time.    No Known Allergies    Changes to allergies: No    SPECIALTY MEDICATION ADHERENCE   Skyrizi - 0 left     Specialty medication(s) dose(s) confirmed: Regimen is correct and unchanged.     Are there any concerns with adherence? No    Adherence counseling provided? Not needed    CLINICAL MANAGEMENT AND INTERVENTION      Clinical Benefit Assessment:    Do you feel the medicine is effective or helping your condition? not yet    Clinical Benefit counseling provided? Reasonable expectations discussed: can take several weeks/months to see effect    Adverse Effects Assessment:    Are you experiencing any  side effects? potentially? rash?    Are you experiencing difficulty administering your medicine? No    Quality of Life Assessment:    How many days over the past month did your HS  keep you from your normal activities? For example, brushing your teeth or getting up in the morning. 0    Have you discussed this with your provider? Not needed    Therapy Appropriateness:    Is therapy appropriate? Yes, therapy is appropriate and should be continued    DISEASE/MEDICATION-SPECIFIC INFORMATION      For patients on injectable medications: Patient currently has 0 doses left.  Next injection is scheduled for Friday, Oct 9.    PATIENT SPECIFIC NEEDS     ? Does the patient have any physical, cognitive, or cultural barriers? No    ? Is the patient high risk? No     ? Does the patient require a Care Management Plan? No ? Does the patient require physician intervention or other additional services (i.e. nutrition, smoking cessation, social work)? No      SHIPPING     Specialty Medication(s) to be Shipped:   Inflammatory Disorders: Skyrizi    Other medication(s) to be shipped: na     Changes to insurance: No    Delivery Scheduled: Yes, Expected medication delivery date: Tues, Oct 6.     Medication will be delivered via UPS to the confirmed home address in [facility].    The patient will receive a drug information handout for each medication shipped and additional FDA Medication Guides as required.  Verified that patient has previously received a Conservation officer, historic buildings.    All of the patient's questions and concerns have been addressed.    Lanney Gins   [facility] Specialty Pharmacist

## 2018-10-14 MED ORDER — CLINDAMYCIN PHOSPHATE 1 % TOPICAL SWAB
PACK | 11 refills | 0 days | Status: CP
Start: 2018-10-14 — End: ?

## 2018-10-14 MED FILL — SKYRIZI 150 MG/1.66 ML(75 MG/0.83 ML X 2) SUBCUTANEOUS SYRINGE KIT: 28 days supply | Qty: 2 | Fill #1

## 2018-10-14 MED FILL — SKYRIZI 150 MG/1.66 ML(75 MG/0.83 ML X 2) SUBCUTANEOUS SYRINGE KIT: 28 days supply | Qty: 2 | Fill #1 | Status: AC

## 2018-11-06 NOTE — Unmapped (Signed)
I spoke with Debbie Gregory today to follow up on the rash/folliculitis that she was having last month. Dr. Janyth Contes prescribed clindamycin wipes, which seem to be helping. She does report worsening HS flares in new areas. I advised it's still early into treatment - she has a f/u scheduled in about a month.    [facility] Clinical Assessment & Refill Coordination Note    Debbie Gregory, DOB: August 31, 1990  Phone: [phone] (home)     All above HIPAA information was verified with patient.     Specialty Medication(s):   Inflammatory Disorders: Skyrizi     Current Outpatient Medications   Medication Sig Dispense Refill   ??? cefdinir (OMNICEF) 300 MG capsule Take 1 capsule (300 mg total) by mouth Two (2) times a day. 60 capsule 1   ??? clindamycin (CLEOCIN T) 1 % Swab Apply to affected areas once daily 60 Package 11   ??? clobetasol (TEMOVATE) 0.05 % ointment Appy twice daily to red areas as needed. 60 g 3   ??? empty container Misc Use as directed to dispose of [employer]. 1 each 2   ??? folic acid (FOLVITE) 1 MG tablet Take 1 tablet (1 mg total) by mouth daily. 100 tablet 3   ??? ketoconazole (NIZORAL) 2 % cream Apply 1 application topically daily. Three times daily in corners of mouth 15 g 11   ??? medroxyPROGESTERone (DEPO-PROVERA) 150 mg/mL injection Inject into the muscle.     ??? methotrexate 2.5 MG tablet Take 6 tablets (15mg ) on one day each week 30 tablet 2   ??? metroNIDAZOLE (FLAGYL) 500 MG tablet Take 1 tablet (500 mg total) by mouth Three (3) times a day. 90 tablet 1   ??? nystatin (MYCOSTATIN) 100,000 unit/gram powder Apply twice daily as needed 60 g 11   ??? propranolol (INDERAL) 20 MG tablet TAKE 1 TABLET BY MOUTH EVERY DAY     ??? risankizumab-rzaa (SKYRIZI) 150mg /1.20mL(75 mg/0.83 mL x2) SyKt Inject the contents of 4 syringes (300mg ) under the skin every 4 weeks. 2 each 11   ??? spironolactone (ALDACTONE) 50 MG tablet Take 0.5 tablets (25 mg total) by mouth daily. 30 tablet 2 No current facility-administered medications for this visit.         Changes to medications: [name] reports starting the following medications: clinda wipes    No Known Allergies    Changes to allergies: No    SPECIALTY MEDICATION ADHERENCE     Skyrizi - 0 left  Medication Adherence    Patient reported X missed doses in the last month: 0  Specialty Medication: Skyrizi  Patient is on additional specialty medications: No          Specialty medication(s) dose(s) confirmed: Regimen is correct and unchanged.     Are there any concerns with adherence? No    Adherence counseling provided? Not needed    CLINICAL MANAGEMENT AND INTERVENTION      Clinical Benefit Assessment:    Do you feel the medicine is effective or helping your condition? No    Clinical Benefit counseling provided? Reasonable expectations discussed: see above    Adverse Effects Assessment:    Are you experiencing any side effects? unclear - folliculitis may be related to HS, is being treated    Are you experiencing difficulty administering your medicine? No    Quality of Life Assessment:    How many days over the past month did your HS  keep you from your normal activities? For example,  brushing your teeth or getting up in the morning. 0    Have you discussed this with your provider? Not needed    Therapy Appropriateness:    Is therapy appropriate? Yes, therapy is appropriate and should be continued    DISEASE/MEDICATION-SPECIFIC INFORMATION      For patients on injectable medications: Patient currently has 0 doses left.  Next injection is scheduled for 11/6.    PATIENT SPECIFIC NEEDS     ? Does the patient have any physical, cognitive, or cultural barriers? No    ? Is the patient high risk? No     ? Does the patient require a Care Management Plan? No     ? Does the patient require physician intervention or other additional services (i.e. nutrition, smoking cessation, social work)? No      SHIPPING     Specialty Medication(s) to be Shipped: Inflammatory Disorders: Skyrizi    Other medication(s) to be shipped: NA     Changes to insurance: No    Delivery Scheduled: Yes, Expected medication delivery date: Tues, Nov 3.     Medication will be delivered via UPS to the confirmed home address in [facility].    The patient will receive a drug information handout for each medication shipped and additional FDA Medication Guides as required.  Verified that patient has previously received a Conservation officer, historic buildings.    All of the patient's questions and concerns have been addressed.    Lanney Gins   [facility] Specialty Pharmacist

## 2018-11-11 MED FILL — SKYRIZI 150 MG/1.66 ML(75 MG/0.83 ML X 2) SUBCUTANEOUS SYRINGE KIT: 28 days supply | Qty: 2 | Fill #2 | Status: AC

## 2018-11-11 MED FILL — SKYRIZI 150 MG/1.66 ML(75 MG/0.83 ML X 2) SUBCUTANEOUS SYRINGE KIT: 28 days supply | Qty: 2 | Fill #2

## 2018-11-29 NOTE — Unmapped (Signed)
[facility] Refill Coordination Note    Specialty Medication(s) to be Shipped:   Inflammatory Disorders: Skyrizi    Other medication(s) to be shipped: n/a     Debbie Gregory, DOB: Sep 15, 1990  Phone: [phone] (home)       All above HIPAA information was verified with patient.     Completed refill call assessment today to schedule patient's medication shipment from the [facility] [phone]).       Specialty medication(s) and dose(s) confirmed: Regimen is correct and unchanged.   Changes to medications: Jhaniya reports no changes at this time.  Changes to insurance: No  Questions for the pharmacist: No    Confirmed patient received Welcome Packet with first shipment. The patient will receive a drug information handout for each medication shipped and additional FDA Medication Guides as required.       DISEASE/MEDICATION-SPECIFIC INFORMATION        For patients on injectable medications: Patient currently has 0 doses left.  Next injection is scheduled for 12/13/2018.    SPECIALTY MEDICATION ADHERENCE     Medication Adherence    Patient reported X missed doses in the last month: 0  Specialty Medication: Skyrizi 150 mg/1.66 ml  Patient is on additional specialty medications: No  Any gaps in refill history greater than 2 weeks in the last 3 months: no  Demonstrates understanding of importance of adherence: yes  Informant: patient  Reliability of informant: reliable  Confirmed plan for next specialty medication refill: delivery by pharmacy  Refills needed for supportive medications: not needed                Skyrizi 150 mg/1.66 ml. 0 on hand      SHIPPING     Shipping address confirmed in Epic.     Delivery Scheduled: Yes, Expected medication delivery date: 12/10/2018.     Medication will be delivered via UPS to the prescription address in [employer] [facility].    Viola Kinnick D Elizabeth Haff   [facility] Specialty Technician

## 2018-12-09 MED FILL — SKYRIZI 150 MG/1.66 ML(75 MG/0.83 ML X 2) SUBCUTANEOUS SYRINGE KIT: 28 days supply | Qty: 2 | Fill #3

## 2018-12-16 ENCOUNTER — Ambulatory Visit: Admit: 2018-12-16 | Discharge: 2018-12-17 | Payer: PRIVATE HEALTH INSURANCE

## 2018-12-16 DIAGNOSIS — L910 Hypertrophic scar: Principal | ICD-10-CM

## 2018-12-16 DIAGNOSIS — L732 Hidradenitis suppurativa: Principal | ICD-10-CM

## 2018-12-16 DIAGNOSIS — Z79899 Other long term (current) drug therapy: Principal | ICD-10-CM

## 2018-12-16 MED ORDER — SECUKINUMAB 150 MG/ML SUBCUTANEOUS PEN INJECTOR
SUBCUTANEOUS | 0 refills | 35.00000 days | Status: CP
Start: 2018-12-16 — End: ?
  Filled 2018-12-19: qty 8, 28d supply, fill #0

## 2018-12-16 MED ORDER — FINASTERIDE 5 MG TABLET
ORAL_TABLET | Freq: Every day | ORAL | 3 refills | 90 days | Status: CP
Start: 2018-12-16 — End: 2019-12-16

## 2018-12-16 MED ORDER — SECUKINUMAB 150 MG/ML SUBCUTANEOUS PEN INJECTOR: 300 mg | mL | 0 refills | 35 days | Status: AC

## 2018-12-16 NOTE — Unmapped (Addendum)
Patient Education        secukinumab  Pronunciation:  SEK ue KIN ue mab  Brand:  Cosentyx  What is the most important information I should know about secukinumab?  Follow all directions on your medicine label and package. Tell each of your healthcare providers about all your medical conditions, allergies, and all medicines you use.  What is secukinumab?  Secukinumab is an immunosuppressant that is used to treat moderate to severe plaque psoriasis, active psoriatic arthritis, or active ankylosing spondylitis.  Secukinumab is also used in adults to treat axial spondyloarthritis.  Secukinumab may also be used for purposes not listed in this medication guide.  What should I discuss with my healthcare provider before using secukinumab?  You should not use secukinumab if you are allergic to it.  Secukinumab is not approved for use by anyone younger than 27 years old.  Tell your doctor if you have ever had tuberculosis or if anyone in your household has tuberculosis. Also tell your doctor if you have recently traveled. Tuberculosis and some fungal infections are more common in certain parts of the world, and you may have been exposed during travel.  Before you start treatment with secukinumab, your doctor may perform tests to make sure you do not have tuberculosis or other infections.  Tell your doctor if you have ever had:  ?? an active or chronic infection;  ?? inflammatory bowel disease (Crohn's disease or ulcerative colitis);  ?? allergy to latex; or  ?? if you currently have signs of infection such as fever, sweats, chills, muscle pain, cough, shortness of breath, cough with bloody mucus, weight loss, skin sores, stomach pain, diarrhea, or painful urination.  Make sure you are current on all vaccines before you begin treatment with secukinumab.  Tell your doctor if you are pregnant or breastfeeding.  How should I use secukinumab? Follow all directions on your prescription label and read all medication guides or instruction sheets. Your doctor may occasionally change your dose. Use the medicine exactly as directed.  Secukinumab is injected under the skin. A healthcare provider may teach you how to properly use the medication by yourself.  Read and carefully follow any Instructions for Use provided with your medicine. You may need to use 2 injections to get your total dose. Ask your doctor or pharmacist if you don't understand all instructions.  Your care provider will show you where on your body to inject secukinumab. Do not inject into skin with active psoriasis, or skin that is red, bruised, or tender. Do not  inject within 2 inches of your navel (belly button).  Secukinumab should be clear or light-yellow. Do not use if the medicine looks cloudy, has changed colors, or has particles in it. Call your pharmacist for new medicine.  Store this medicine in the original container in a refrigerator. Protect from light and do not shake or freeze. Take the medicine out of the refrigerator and allow it to reach room temperature for 15 to 30 minutes before injecting your dose. Give the injection within 1 hour after removing the medicine from a refrigerator.  Use a needle and syringe only once and then place them in a puncture-proof sharps container. Follow state or local laws about how to dispose of this container. Keep it out of the reach of children and pets.  Each prefilled syringe or injection pen is for one use only. Throw it away after one use, even if there is still medicine left inside.  What happens if  I miss a dose?  Call your doctor for instructions if you miss a dose of secukinumab.  What happens if I overdose?  Seek emergency medical attention or call the Poison Help line at [phone].  What should I avoid while using secukinumab? Do not receive a live vaccine while using secukinumab. Live vaccines include measles, mumps, rubella (MMR), polio, rotavirus, typhoid, yellow fever, varicella (chickenpox), zoster (shingles), and nasal flu (influenza) vaccine.  What are the possible side effects of secukinumab?  Get emergency medical help if you have signs of an allergic reaction: hives; chest tightness, difficult breathing; feeling like you might pass out; swelling of your face, lips, tongue, or throat.  Call your doctor at once if you have:  ?? redness, warmth, or painful sores on your skin;  ?? cough, shortness of breath, cough with red or pink mucus;  ?? increased urination, burning when you urinate;  ?? sores or white patches in your mouth or throat (yeast infection or thrush);  ?? diarrhea, stomach pain; or  ?? fever, chills, sweating, muscle pain, weight loss.  Common side effects may include:  ?? diarrhea; or  ?? cold symptoms such as stuffy nose, sneezing, sore throat.  This is not a complete list of side effects and others may occur. Call your doctor for medical advice about side effects. You may report side effects to FDA at 1-800-FDA-1088.  What other drugs will affect secukinumab?  Other drugs may affect secukinumab, including prescription and over-the-counter medicines, vitamins, and herbal products. Tell your doctor about all your current medicines and any medicine you start or stop using.  Where can I get more information?  Your doctor or pharmacist can provide more information about secukinumab.  Remember, keep this and all other medicines out of the reach of children, never share your medicines with others, and use this medication only for the indication prescribed. Every effort has been made to ensure that the information provided by [employer] ('[employer]') is accurate, up-to-date, and complete, but no guarantee is made to that effect. Drug information contained herein may be time sensitive. [employer] information has been compiled for use by healthcare practitioners and consumers in the [place] and therefore [employer] does not warrant that uses outside of the [place] are appropriate, unless specifically indicated otherwise. [employer]'s drug information does not endorse drugs, diagnose patients or recommend therapy. [employer]'s drug information is an [employer], corporate to assist licensed healthcare practitioners in caring for their patients and/or to serve consumers viewing this service as a supplement to, and not a substitute for, the expertise, skill, knowledge and judgment of healthcare practitioners. The absence of a warning for a given drug or drug combination in no way should be construed to indicate that the drug or drug combination is safe, effective or appropriate for any given patient. [employer] does not assume any responsibility for any aspect of healthcare administered with the aid of information [employer] provides. The information contained herein is not intended to cover all possible uses, directions, precautions, warnings, drug interactions, allergic reactions, or adverse effects. If you have questions about the drugs you are taking, check with your doctor, nurse or pharmacist.  Copyright [phone] [employer] Version: 6.01. Revision date: 06/27/2018.  Care instructions adapted under license by [facility]. If you have questions about a medical condition or this instruction, always ask your healthcare professional. Health[employer], Incorporated disclaims any warranty or liability for your use of this information.     Patient Education  Hidradenitis Suppurativa: Care Instructions  Your Care Instructions Hidradenitis suppurativa (say hih-drad-uh-NY-tus sup-yur-uh-TY-vuh) is a skin condition that causes lumps on the skin that look like pimples or boils. The lumps are usually painful and can break open and drain blood and bad-smelling pus. The condition can come and go for many years.  Treatment for this condition may include antibiotics and other medicines. You may need surgery to remove the lumps. Home care includes wearing loose-fitting clothes and washing the area gently. You can help prevent lumps from coming back by staying at a healthy weight and not smoking.  Doctors don't know exactly how this condition starts. But they do know that something irritates and inflames the hair follicles, causing them to swell and form lumps. This skin condition can't be spread from person to person (isn't contagious).  Follow-up care is a key part of your treatment and safety. Be sure to make and go to all appointments, and call your doctor if you are having problems. It's also a good idea to know your test results and keep a list of the medicines you take.  How can you care for yourself at home?  Skin care  ?? ?? Wash the area every day with mild soap. Use your hands rather than a washcloth or sponge when you wash that part of your body.   ?? ?? Leave the affected areas uncovered when you can. If you have lumps that are draining, you can cover them with a bandage or other dressing. Put petroleum jelly (such as Vaseline) on the dressing to help keep it from sticking.   ?? ?? Wear-loose fitting clothes that don't rub against the area. Avoid activities that cause skin to rub together.   ?? ?? If you have pain, try a warm compress. Soak a towel or washcloth in warm water, wring it out, and place it on the affected skin for about 10 minutes.   Medicines ?? ?? Be safe with medicines. Take your medicines exactly as prescribed. Call your doctor if you think you are having a problem with your medicine. You will get more details on the specific medicines your doctor prescribes.   ?? ?? If your doctor prescribed antibiotics, take them as directed. Do not stop taking them just because you feel better. You need to take the full course of antibiotics.   Lifestyle choices  ?? ?? If you smoke, think about quitting. Smoking can make the condition worse. If you need help quitting, talk to your doctor about stop-smoking programs and medicines. These can increase your chances of quitting for good.   ?? ?? Stay at a healthy weight, or lose weight, by eating healthy foods and being physically active. Being overweight could make this condition worse.   When should you call for help?   Call your doctor now or seek immediate medical care if:  ?? ?? You have symptoms of infection, such as:  ? Increased pain, swelling, warmth, or redness.  ? Red streaks leading from the area.  ? Pus draining from the area.  ? A fever.   Watch closely for changes in your health, and be sure to contact your doctor if:  ?? ?? You do not get better as expected.   Where can you learn more?  Go to [facility] at [email or web address]  Select Patient Education under [employer]. Enter [phone] in the search box to learn more about Hidradenitis Suppurativa: Care Instructions.  Current as of: July 11, 2018??????????????????????????????Content Version: 12.7  ??  2006-2020 Healthwise, Incorporated.   Care instructions adapted under license by [facility]. If you have questions about a medical condition or this instruction, always ask your healthcare professional. Healthwise, Incorporated disclaims any warranty or liability for your use of this information.

## 2018-12-16 NOTE — Unmapped (Signed)
Dermatology Clinic Visit      Assessment and Plan:  Hidradenitis Suppurativa, Hurley 2   - We discussed the typical natural history, pathogenesis, treatment options, and expected course as well as the relapsing and sometimes recalcitrant nature of the disease.    -Continue Skyrizi 300mg  q 4 weeks until we can figure out Cosentyx as possible next step (Rinvoq as backup).  Side effects were reviewed  -Plan to start Cosentyx 300mg  week 0, 1, 2, 3, 4 then q 2 weeks, side effects reviewed  - finasteride 5mg  once daily, side effects reviewed    High Risk Medication use (methotrexate)  - Acceptable CBC, BUN, Cr, AST and ALT in 03/2018  - Quant gold and hepatitis serologies normal in 08/2017    RV: 3 months with Dr. Janyth Contes in [facility] clinic    ________________________________________________________________________________________________________    CC:  Hidradenitis Suppurativa    HPI:    Debbie Gregory is a friendly 28 y.o. female with a history of HS presenting for follow-up  She was started on infliximab in 08/2017 and titrated up to 10 mg/kg every 4 weeks.  This was very helpful in the beginning but efficacy waned and so we instead changed her over to [employer] in April 2020 with a dose of 200 mg/kg that did not help. Started Skyrizi 10/2018 and does not feel like it is helping much either.  Self-reported severity (0-5): 5  VAS pain today: 3  VAS average pain for the last month: 6  Requiring pain medication? Yes.  If so, what type/frequency? pred vicks, bath,heating pad otc pain meds andcbd  Developing new lesions? once a month  Number of inflammatory lesions montly: 3-5  DLQI: 19  Current treatment: no treatment     How helpful is the current treatment in managing the following aspects of your disease?  Not at all helpful Somewhat helpful Very helpful   Pain x ?? ??   Decreasing length of flares ?? x ??   Decreasing new lesions x ?? ??   Drainage x ?? ??   Decreasing frequency of flares x ?? ??   Decreasing severity of flares x Odor x ?? ??   ??    Disease course:  Year when symptoms first noticed: Age 77-13  Year of diagnosis: 2007  Who diagnosed you? Dermatologist  Location of first symptoms: groin  Typical involved areas include: groin, underarms, under the breasts, inner thighs, buttocks, face, stomach, top of thighs  Typical number of inflammatory lesions each month at baseline (from first visit): 5-10  Disease triggers: stress, sweat, heat, exercise, menstrual cycle    Are menstrual cycles irregular when not on birth control? No.  Current form of contraception: Depo  Effect of hormonal contraception on disease: No change  Flaring with menstrual cycle (before, during, or after?): during  Difficulty becoming pregnancy? No.  Pregnancy complications? pre-eclampsia  How many children? 1  Better or worse with pregnancy?  worse.  If so, worse during a specific trimester? third      FH:      Patient Mother Father Son Daughter Brother Sister Maternal Grandmother Maternal Grandfather Paternal Grandmother Paternal Grandfather Maternal Aunt Maternal Uncle Paternal Aunt Paternal Uncle Other:   Derm Hidradenitis Suppurativa   x                                   Pilonidal sinus     x  Acne                                      Dissecting cellulitis(Scalp)                                     Eczema         x                            Allergies                    Rheum Joint pains                                      SAPHO                                     Pyoderma gangrenosum                                     Back pain                                    Auto-immune disease                                     Asthma                    Endo Polycystic ovarian syndrome                                     Thyroid disease  x                  Vitamin D deficiency  x                 Psych Anxiety                                     Depression                                     Dementia Suicidal thoughts*                                   Cardio Hypertension                                     High cholesterol    x  Heart attack                                    Stroke                                     Hem-onc Cancer  ______________                                    Anemia  x                  GI IBD (UC/Crohn's)                                   ID HIV                     Syphilis                     Other                         Social History:  Current or former smoker? current  Amount smoking: less than half ppd  How many years: Not answered  ED visits in the last 5 years? never  Difficulty affording medications? never  Marital Status: single  Living with some one? Yes.    Prior treatments:  Topical: Did not list which ones  Systemic: Doxycycline,Minocycline, Clindamycin, Humira, Spirinolactone  Past surgical procedures: None  Past laser procedures: None        ROS:     The balance of 10 systems is negative unless otherwise documented.     OBJECTIVE:   Gen: Well-appearing patient, appropriate, interactive, in no acute distress  Skin: Examination of the scalp, face, neck, chest, back, abdomen, bilateral upper and lower extremities, hands, palms, soles, nails, buttocks, and external genitalia performed today and pertinent for:   ??  location Abscess Inflamed nodule Non-inflamed nodule Draining sinus Non-draining Sinus [place]ley % scar   R axilla ?? ??2 ?? ??2  ??2 ??   L axilla ?? 1 ?? 1 ?? ??2 ??   R inframammary ??  ?? ?? ?? ?? ??   L inframammary ?? 1 ?? ?? ?? ?? ??   Intermammary ?? ?? ?? ?? ?? ?? ??   Pubic ?? ?? ?? ?? ?? ?? ??   R inguinal ?? ??2 ?? ?? ??1 ?? ??   R thigh ?? ?? ?? ?? ?? ?? ??   L inguinal ?? ??2 ?? ??1 ??1 ??2 ??   L thigh ?? ?? ?? ?? ?? ?? ??   Scrotum/Vulva ?? ?? ?? ?? ?? ?? ??   Perianal ?? ?? ?? ?? ?? ?? ??   R buttock ?? ??2 ?? ??1 ?? ?? ??   L buttock ?? ??2 ?? ??1 ?? ?? ??   Other (list) ?? ?? ?? ?? ?? ?? ??   ?? ?? ?? ?? ?? ?? ?? ??   ??    -sites not commented on demonstrate normal findings.    AN count (total sum of abscess and inflammatory nodule): 12  Pilonidal sinus (Y/N, or previously treated)? No.  Approximate BSA involved by inflammatory lesions: 1%  Intertriginous comedones: few  Diffuse comedones (trunk, face, etc): none  Acne scars: none  Cribriform scarring: No.  Intertrigionus epidermal inclusion cysts: 1-3  Diffuse (trunk, feace, extremities) epidermal inclusion cysts: 1-3  Regular phenotype

## 2018-12-16 NOTE — Unmapped (Deleted)
Self-reported severity (0-5): 5  VAS pain today: 3  VAS average pain for the last month: 6  Requiring pain medication? Yes.  If so, what type/frequency? pred vicks, bath,heating pad otc pain meds andcbd  How often in pain?  {hspain:53295}  Level of odor (0-5): {0-5 selection:53296}  Level of itching (0-5): {0-5 selection:53296}  Dressing changes needed for drainage:{hs dressings:52675}  How much drainage: {hsdrainage:52676}  Flare in the last month (Y/N)? {YES/NO:21013}  How long ago was the last flare? {last flare:53297}  Developing new lesions? once a month  Number of inflammatory lesions montly: 3-5  DLQI: 19  Current treatment: no treatment     How helpful is the current treatment in managing the following aspects of your disease?  Not at all helpful Somewhat helpful Very helpful   Pain x     Decreasing length of flares  x    Decreasing new lesions x     Drainage x     Decreasing frequency of flares x     Decreasing severity of flares x     Odor x

## 2018-12-17 DIAGNOSIS — L409 Psoriasis, unspecified: Principal | ICD-10-CM

## 2018-12-17 DIAGNOSIS — L4050 Arthropathic psoriasis, unspecified: Principal | ICD-10-CM

## 2018-12-17 NOTE — Unmapped (Signed)
[facility] Specialty Medication Onboarding    Specialty Medication: COSENTYX PENS LOADING AND MAINTENANCE DOSES  Prior Authorization: Approved   Financial Assistance: No - copay  <$25  Final Copay/Day Supply: $3 / 28 DAYS    Insurance Restrictions: None     Notes to Pharmacist:     The triage team has completed the benefits investigation and has determined that the patient is able to fill this medication at [facility]. Please contact the patient to complete the onboarding or follow up with the prescribing physician as needed.

## 2018-12-17 NOTE — Unmapped (Signed)
Per test claim for Cosentyx at the [facility], patient needs Medication Assistance Program for Prior Authorization.

## 2018-12-18 NOTE — Unmapped (Signed)
I verified with Dr. Janyth Contes that he was OK with Debbie Gregory starting her Cosentyx soon without a washout period for her Cristy Folks. She was counseled on this, and to report any new signs/sxs of infection.     [facility]   Patient Onboarding/Medication Counseling    Debbie Gregory is a 28 y.o. female with hidradenitis who I am counseling today on initiation of therapy.  I am speaking to the patient.    Was a Nurse, learning disability used for this call? No    Verified patient's date of birth / HIPAA.    Specialty medication(s) to be sent: Inflammatory Disorders: Cosentyx      Non-specialty medications/supplies to be sent: na (has sharps)      Medications not needed at this time: na         Cosentyx (secukinumab)    Medication & Administration     Dosage: Off-label HS dosing: 300 mg (2 pens) once weekly for 5 weeks, then 300 mg every 14 days thereafter.      Lab tests required prior to treatment initiation:  ? Tuberculosis: Tuberculosis screening resulted in a non-reactive Quantiferon TB Gold assay.      Administration:     Prefilled syringe  1. Gather all supplies needed for injection on a clean, flat working surface: medication syringe(s) removed from packaging, alcohol swab, sharps container, etc.  2. Look at the medication label ??? look for correct medication, correct dose, and check the expiration date  3. Look at the medication ??? the liquid in the syringe should appear clear and colorless to slightly yellow  4. Lay the syringe on a flat surface and allow it to warm up to room temperature for at least 15-30 minutes  5. Select injection site ??? you can use the front of your thigh or your belly (but not the area 2 inches around your belly button); if someone else is giving you the injection you can also use your upper arm in the skin covering your triceps muscle 6. Prepare injection site ??? wash your hands and clean the skin at the injection site with an alcohol swab and let it air dry, do not touch the injection site again before the injection  7. Pull off the needle safety cap, do not remove until immediately prior to injection  8. Pinch the skin ??? with your hand not holding the syringe pinch up a fold of skin at the injection site using your forefinger and thumb  9. Insert the needle into the fold of skin at about a 45 degree angle ??? it's best to use a quick dart-like motion  10. Push the plunger down slowly as far as it will go until the syringe is empty, if the plunger is not fully depressed the needle shield will not extend to cover the needle when it is removed, hold the syringe in place for a full 5 seconds  11. Check that the syringe is empty and keep pressing down on the plunger while you pull the needle out at the same angle as inserted; after the needle is removed completely from the skin, release the plunger allowing the needle shield to activate and cover the used needle  12. Dispose of the used syringe immediately in your sharps disposal container, do not attempt to recap the needle prior to disposing  13. If you see any blood at the injection site, press a cotton ball or gauze on the site and maintain pressure until the bleeding stops, do not  rub the injection site      Prefilled Sensoready?? auto-injector pen  1. Gather all supplies needed for injection on a clean, flat working surface: medication pen removed from packaging, alcohol swab, sharps container, etc.  2. Look at the medication label ??? look for correct medication, correct dose, and check the expiration date  3. Look at the medication ??? the liquid visible in the window on the side of the pen device should appear clear and colorless to slightly yellow  4. Lay the auto-injector pen on a flat surface and allow it to warm up to room temperature for at least 15-30 minutes 5. Select injection site ??? you can use the front of your thigh or your belly (but not the area 2 inches around your belly button); if someone else is giving you the injection you can also use your upper arm in the skin covering your triceps muscle  6. Prepare injection site ??? wash your hands and clean the skin at the injection site with an alcohol swab and let it air dry, do not touch the injection site again before the injection  7. Twist off the purple safety cap in the direction of the arrow, do not remove until immediately prior to injection and do not touch the yellow needle cover  8. Put the white needle cover against your skin at the injection site at a 90 degree angle, hold the pen such that you can see the clear medication window  9. Press down and hold the pen firmly against your skin, there will be a click when the injection starts  10. Continue to hold the pen firmly against your skin for about 10-15 seconds ??? the window will start to turn solid green  11. There will be a second click sound when the injection is almost complete, verify the window is solid green to indicate the injection is complete and then pull the pen away from your skin  12. Dispose of the used auto-injector pen immediately in your sharps disposal container the needle will be covered automatically  13. If you see any blood at the injection site, press a cotton ball or gauze on the site and maintain pressure until the bleeding stops, do not rub the injection site      Adherence/Missed dose instructions:  If your injection is given more than 4 days after your scheduled injection date ??? consult your pharmacist for additional instructions on how to adjust your dosing schedule.        Goals of Therapy     Ankylosing spondylitis  ? Relief of symptoms  ? Maintenance of function  ? Prevention of complications of spinal disease  ? Minimization of extraspinal and extraarticular manifestations and comorbidities ? Maintenance of effective psychosocial functioning    Plaque Psoriasis  ? Minimize areas of skin involvement (% BSA)  ? Avoidance of long term glucocorticoid use  ? Maintenance of effective psychosocial functioning    Psoriatic arthritis  ? Achieve remission/inactive disease or low/minimal disease activity  ? Maintenance of function  ? Minimization of systemic manifestations and comorbidities  ? Maintenance of effective psychosocial functioning      Side Effects & Monitoring Parameters     ? Injection site reaction (redness, irritation, inflammation localized to the site of administration)  ? Signs of a common cold ??? minor sore throat, runny or stuffy nose, etc.  ? Diarrhea    The following side effects should be reported to the provider:  ? Signs of  a hypersensitivity reaction ??? rash; hives; itching; red, swollen, blistered, or peeling skin; wheezing; tightness in the chest or throat; difficulty breathing, swallowing, or talking; swelling of the mouth, face, lips, tongue, or throat; etc.  ? Reduced immune function ??? report signs of infection such as fever; chills; body aches; very bad sore throat; ear or sinus pain; cough; more sputum or change in color of sputum; pain with passing urine; wound that will not heal, etc.  Also at a slightly higher risk of some malignancies (mainly skin and blood cancers) due to this reduced immune function.  o In the case of signs of infection ??? the patient should hold the next dose of Cosentyx?? and call your primary care provider to ensure adequate medical care.  Treatment may be resumed when infection is treated and patient is asymptomatic.  ? Muscle pain or weakness  ? Shortness of breath      Warnings, Precautions, & Contraindications     ? Have your bloodwork checked as you have been told by your prescriber  ? Talk with your doctor if you are pregnant, planning to become pregnant, or breastfeeding ? Discuss the possible need for holding your dose(s) of Cosentyx?? when a planned procedure is scheduled with the prescriber as it may delay healing/recovery timeline       Drug/Food Interactions     ? Medication list reviewed in Epic. The patient was instructed to inform the care team before taking any new medications or supplements. No drug interactions identified.   ? If you have a latex allergy use caution when handling, the needle cap of the Cosentyx?? prefilled syringe and the safety cap for the Cosentyx Sensoready?? pen contains a derivative of natural rubber latex  ? Talk with you prescriber or pharmacist before receiving any live vaccinations while taking this medication and after you stop taking it      Storage, Handling Precautions, & Disposal     ? Store this medication in the refrigerator.  Do not freeze  ? If needed, you may store at room temperature for up to 1 hour  ? Store in [employer], protected from light  ? Do not shake  ? Dispose of used syringes/pens in a sharps disposal container           Current Medications (including OTC/herbals), Comorbidities and Allergies     Current Outpatient Medications   Medication Sig Dispense Refill   ??? finasteride (PROSCAR) 5 mg tablet Take 1 tablet (5 mg total) by mouth daily. 90 tablet 3   ??? medroxyPROGESTERone (DEPO-PROVERA) 150 mg/mL injection Inject into the muscle.     ??? nystatin (MYCOSTATIN) 100,000 unit/gram powder Apply twice daily as needed 60 g 11   ??? propranolol (INDERAL) 20 MG tablet TAKE 1 TABLET BY MOUTH EVERY DAY     ??? risankizumab-rzaa (SKYRIZI) 150mg /1.82mL(75 mg/0.83 mL x2) SyKt Inject the contents of 4 syringes (300mg ) under the skin every 4 weeks. 2 each 11   ??? secukinumab (COSENTYX) 150 mg/mL PnIj injection Inject the contents of 2 pens (300 mg total) under the skin every fourteen (14) days. 3 mL 3 ??? secukinumab (COSENTYX) 150 mg/mL PnIj injection Inject the contents of 2 pens (300 mg total) under the skin once a week. For the first 5 doses. 10 mL 0     No current facility-administered medications for this visit.        No Known Allergies    Patient Active Problem List   Diagnosis   ??? Hidradenitis  suppurativa       Reviewed and up to date in Epic.    Appropriateness of Therapy     Is medication and dose appropriate based on diagnosis? No - evidence provided by prescriber in 12/7 note    Prescription has been clinically reviewed: Yes    Baseline Quality of Life Assessment      How many days over the past month did your HS keep you from your normal activities? 0    Financial Information     Medication Assistance provided: Prior Authorization    Anticipated copay of $3 reviewed with patient. Verified delivery address.    Delivery Information     Scheduled delivery date: Friday, Dec 11    Expected start date: Friday, Dec 11    Medication will be delivered via UPS to the prescription address in [place].  This shipment will not require a signature.      Explained the services we provide at [facility] and that each month we would call to set up refills.  Stressed importance of returning phone calls so that we could ensure they receive their medications in time each month.  Informed patient that we should be setting up refills 7-10 days prior to when they will run out of medication.  A pharmacist will reach out to perform a clinical assessment periodically.  Informed patient that a welcome packet and a drug information handout will be sent.      Patient verbalized understanding of the above information as well as how to contact the pharmacy at [phone] option 4 with any questions/concerns.  The pharmacy is open Monday through Friday 8:30am-4:30pm.  A pharmacist is available 24/7 via pager to answer any clinical questions they may have.    Patient Specific Needs ? Does the patient have any physical, cognitive, or cultural barriers? No    ? Patient prefers to have medications discussed with  Patient     ? Is the patient or caregiver able to read and understand education materials at a high school level or above? Yes    ? Patient's primary language is  English     ? Is the patient high risk? No     ? Does the patient require a Care Management Plan? No     ? Does the patient require physician intervention or other additional services (i.e. nutrition, smoking cessation, social work)? No      Keelon Zurn A Desiree Lucy [facility] Specialty Pharmacist

## 2018-12-19 MED FILL — COSENTYX PEN 300 MG/2 PENS (150 MG/ML) SUBCUTANEOUS: 28 days supply | Qty: 8 | Fill #0 | Status: AC

## 2019-01-08 NOTE — Unmapped (Signed)
Debbie Gregory reports things have gone well so far with her new Cosentyx injections. She prefers using the pre-filled syringe (vs. Auto pen), so I'll request a change for her.     She hasn't noticed any changes in her skin yet, but she understands it's still early into treatment. She has continued to have some flares over the last month.     We reviewed her last loading dose will be on 1/8, then her maintenance dosing of every 14 days will begin on 1/22.     [facility] Clinical Assessment & Refill Coordination Note    Debbie Gregory, DOB: [birth year]  Phone: [phone] (home)     All above HIPAA information was verified with patient.     Was a Nurse, learning disability used for this call? No    Specialty Medication(s):   Inflammatory Disorders: Cosentyx     Current Outpatient Medications   Medication Sig Dispense Refill   ??? finasteride (PROSCAR) 5 mg tablet Take 1 tablet (5 mg total) by mouth daily. 90 tablet 3   ??? medroxyPROGESTERone (DEPO-PROVERA) 150 mg/mL injection Inject into the muscle.     ??? nystatin (MYCOSTATIN) 100,000 unit/gram powder Apply twice daily as needed 60 g 11   ??? propranolol (INDERAL) 20 MG tablet TAKE 1 TABLET BY MOUTH EVERY DAY     ??? secukinumab (COSENTYX) 150 mg/mL Syrg Inject the contents of 2 syringes (300 mg) under the skin every fourteen (14) days. 4 mL 3   ??? secukinumab (COSENTYX) 150 mg/mL PnIj injection Inject the contents of 2 pens (300 mg total) under the skin once a week. For the first 5 doses. 10 mL 0     No current facility-administered medications for this visit.         Changes to medications: Casmira reports no changes at this time.    No Known Allergies    Changes to allergies: No    SPECIALTY MEDICATION ADHERENCE     Cosentyx - 2 left (1 dose)  Medication Adherence    Patient reported X missed doses in the last month: 0  Specialty Medication: Cosentyx.   Patient is on additional specialty medications: No Specialty medication(s) dose(s) confirmed: Regimen is correct and unchanged.     Are there any concerns with adherence? No    Adherence counseling provided? Not needed    CLINICAL MANAGEMENT AND INTERVENTION      Clinical Benefit Assessment:    Do you feel the medicine is effective or helping your condition? not yet    Clinical Benefit counseling provided? Reasonable expectations discussed: early in treatment    Adverse Effects Assessment:    Are you experiencing any side effects? No    Are you experiencing difficulty administering your medicine? No    Quality of Life Assessment:    How many days over the past month did your HS  keep you from your normal activities? For example, brushing your teeth or getting up in the morning. 0    Have you discussed this with your provider? Not needed    Therapy Appropriateness:    Is therapy appropriate? Yes, therapy is appropriate and should be continued    DISEASE/MEDICATION-SPECIFIC INFORMATION      For patients on injectable medications: Patient currently has 1 doses left.  Next injection is scheduled for Friday, 1/1.    PATIENT SPECIFIC NEEDS     ? Does the patient have any physical, cognitive, or cultural barriers? No    ? Is the  patient high risk? No     ? Does the patient require a Care Management Plan? No     ? Does the patient require physician intervention or other additional services (i.e. nutrition, smoking cessation, social work)? No      SHIPPING     Specialty Medication(s) to be Shipped:   Inflammatory Disorders: Cosentyx    Other medication(s) to be shipped: na     Changes to insurance: No    Delivery Scheduled: Yes, Expected medication delivery date: Thurs, Jan 7.     Medication will be delivered via UPS to the confirmed prescription address in [facility].    The patient will receive a drug information handout for each medication shipped and additional FDA Medication Guides as required.  Verified that patient has previously received a Conservation officer, historic buildings. All of the patient's questions and concerns have been addressed.    Lanney Gins   [facility] Specialty Pharmacist

## 2019-01-15 NOTE — Unmapped (Signed)
Debbie Gregory 's Cosentyx shipment will be delayed due to Insufficient inventory We have contacted the patient and communicated the delivery change to patient/caregiver We will reschedule the medication for the delivery date that the patient agreed upon. We have confirmed the delivery date as 01/17/2019.

## 2019-01-16 MED FILL — COSENTYX 300 MG/2 SYRINGES (150 MG/ML) SUBCUTANEOUS: 28 days supply | Qty: 4 | Fill #0 | Status: AC

## 2019-01-16 MED FILL — COSENTYX 300 MG/2 SYRINGES (150 MG/ML) SUBCUTANEOUS: SUBCUTANEOUS | 28 days supply | Qty: 4 | Fill #0

## 2019-02-05 NOTE — Unmapped (Signed)
[facility] Refill Coordination Note    Specialty Medication(s) to be Shipped:   Inflammatory Disorders: Cosentyx    Other medication(s) to be shipped: n/a     Debbie Gregory, DOB: Sep 17, 1990  Phone: [phone] (home)       All above HIPAA information was verified with patient.     Was a Nurse, learning disability used for this call? No    Completed refill call assessment today to schedule patient's medication shipment from the [facility] [phone]).       Specialty medication(s) and dose(s) confirmed: Regimen is correct and unchanged.   Changes to medications: Debbie Gregory reports no changes at this time.  Changes to insurance: No  Questions for the pharmacist: No    Confirmed patient received Welcome Packet with first shipment. The patient will receive a drug information handout for each medication shipped and additional FDA Medication Guides as required.       DISEASE/MEDICATION-SPECIFIC INFORMATION        For patients on injectable medications: Patient currently has 0 doses left.  Next injection is scheduled for 02/14/2019.    SPECIALTY MEDICATION ADHERENCE     Medication Adherence    Patient reported X missed doses in the last month: 0  Specialty Medication: Cosentyx 150 mg/ml  Patient is on additional specialty medications: No  Any gaps in refill history greater than 2 weeks in the last 3 months: no  Demonstrates understanding of importance of adherence: yes  Informant: patient  Reliability of informant: reliable  Confirmed plan for next specialty medication refill: delivery by pharmacy  Refills needed for supportive medications: not needed                Cosentyx 150 mg/ml. 0 on hand      SHIPPING     Shipping address confirmed in Epic.     Delivery Scheduled: Yes, Expected medication delivery date: 02/11/2019.     Medication will be delivered via UPS to the prescription address in Epic [facility].    Debbie Gregory D Oday Ridings   [facility] Specialty Technician

## 2019-02-10 MED FILL — COSENTYX 300 MG/2 SYRINGES (150 MG/ML) SUBCUTANEOUS: SUBCUTANEOUS | 28 days supply | Qty: 4 | Fill #1

## 2019-02-10 MED FILL — COSENTYX 300 MG/2 SYRINGES (150 MG/ML) SUBCUTANEOUS: 28 days supply | Qty: 4 | Fill #1 | Status: AC

## 2019-02-26 NOTE — Unmapped (Signed)
Appointment 03/08 at 1:00 pm with Dr Janyth Contes    Thanks

## 2019-02-26 NOTE — Unmapped (Signed)
Patient needs an appointment scheduled. Last ov 12/2018. Thanks!

## 2019-03-06 NOTE — Unmapped (Signed)
[facility] Refill Coordination Note    Specialty Medication(s) to be Shipped:   Inflammatory Disorders: Cosentyx    Other medication(s) to be shipped: n/a     Debbie Gregory, DOB: 1990-07-20  Phone: [phone] (home)       All above HIPAA information was verified with patient.     Was a Nurse, learning disability used for this call? No    Completed refill call assessment today to schedule patient's medication shipment from the [facility] [phone]).       Specialty medication(s) and dose(s) confirmed: Regimen is correct and unchanged.   Changes to medications: Daisey reports no changes at this time.  Changes to insurance: No  Questions for the pharmacist: No    Confirmed patient received Welcome Packet with first shipment. The patient will receive a drug information handout for each medication shipped and additional FDA Medication Guides as required.       DISEASE/MEDICATION-SPECIFIC INFORMATION        For patients on injectable medications: Patient currently has 0 doses left.  Next injection is scheduled for 03/14/2019.    SPECIALTY MEDICATION ADHERENCE     Medication Adherence    Patient reported X missed doses in the last month: 0  Specialty Medication: Cosentyx 150 mg/ml  Patient is on additional specialty medications: No  Any gaps in refill history greater than 2 weeks in the last 3 months: no  Demonstrates understanding of importance of adherence: yes  Informant: patient  Reliability of informant: reliable  Confirmed plan for next specialty medication refill: delivery by pharmacy  Refills needed for supportive medications: not needed                Cosentyx 150 mg/ml. 0 on hand      SHIPPING     Shipping address confirmed in Epic.     Delivery Scheduled: Yes, Expected medication delivery date: 03/11/2019.     Medication will be delivered via UPS to the prescription address in Epic WAM.    Mikela Senn D Devonta Blanford   [facility] Specialty Technician

## 2019-03-10 MED FILL — COSENTYX 300 MG/2 SYRINGES (150 MG/ML) SUBCUTANEOUS: SUBCUTANEOUS | 28 days supply | Qty: 4 | Fill #2

## 2019-03-10 MED FILL — COSENTYX 300 MG/2 SYRINGES (150 MG/ML) SUBCUTANEOUS: 28 days supply | Qty: 4 | Fill #2 | Status: AC

## 2019-03-17 ENCOUNTER — Encounter: Admit: 2019-03-17 | Discharge: 2019-03-18 | Payer: PRIVATE HEALTH INSURANCE

## 2019-03-17 MED ORDER — AMOXICILLIN 875 MG-POTASSIUM CLAVULANATE 125 MG TABLET
ORAL_TABLET | Freq: Two times a day (BID) | ORAL | 3 refills | 30 days | Status: CP
Start: 2019-03-17 — End: 2019-04-16

## 2019-03-17 NOTE — Unmapped (Addendum)
Patient Education        Hidradenitis Suppurativa: Care Instructions  Your Care Instructions     Hidradenitis suppurativa (say hih-drad-uh-NY-tus sup-yur-uh-TY-vuh) is a skin condition that causes lumps on the skin that look like pimples or boils. The lumps are usually painful and can break open and drain blood and bad-smelling pus. The condition can come and go for many years.  Treatment for this condition may include antibiotics and other medicines. You may need surgery to remove the lumps. Home care includes wearing loose-fitting clothes and washing the area gently. You can help prevent lumps from coming back by staying at a healthy weight and not smoking.  Doctors don't know exactly how this condition starts. But they do know that something irritates and inflames the hair follicles, causing them to swell and form lumps. This skin condition can't be spread from person to person (isn't contagious).  Follow-up care is a key part of your treatment and safety. Be sure to make and go to all appointments, and call your doctor if you are having problems. It's also a good idea to know your test results and keep a list of the medicines you take.  How can you care for yourself at home?  Skin care  ?? ?? Wash the area every day with mild soap. Use your hands rather than a washcloth or sponge when you wash that part of your body.   ?? ?? Leave the affected areas uncovered when you can. If you have lumps that are draining, you can cover them with a bandage or other dressing. Put petroleum jelly (such as Vaseline) on the dressing to help keep it from sticking.   ?? ?? Wear-loose fitting clothes that don't rub against the area. Avoid activities that cause skin to rub together.   ?? ?? If you have pain, try a warm compress. Soak a towel or washcloth in warm water, wring it out, and place it on the affected skin for about 10 minutes.   Medicines  ?? ?? Be safe with medicines. Take your medicines exactly as prescribed. Call your doctor if you think you are having a problem with your medicine. You will get more details on the specific medicines your doctor prescribes.   ?? ?? If your doctor prescribed antibiotics, take them as directed. Do not stop taking them just because you feel better. You need to take the full course of antibiotics.   Lifestyle choices  ?? ?? If you smoke, think about quitting. Smoking can make the condition worse. If you need help quitting, talk to your doctor about stop-smoking programs and medicines. These can increase your chances of quitting for good.   ?? ?? Stay at a healthy weight, or lose weight, by eating healthy foods and being physically active. Being overweight could make this condition worse.   When should you call for help?   Call your doctor now or seek immediate medical care if:  ?? ?? You have symptoms of infection, such as:  ? Increased pain, swelling, warmth, or redness.  ? Red streaks leading from the area.  ? Pus draining from the area.  ? A fever.   Watch closely for changes in your health, and be sure to contact your doctor if:  ?? ?? You do not get better as expected.   Where can you learn more?  Go to [facility] at https://myuncchart.org  Select Patient Education under [employer]. Enter [phone] in the search box to learn more about Hidradenitis  Suppurativa: Care Instructions.  Current as of: July 11, 2018??????????????????????????????Content Version: 12.8  ?? 2006-2021 [employer].   Care instructions adapted under license by [facility]. If you have questions about a medical condition or this instruction, always ask your healthcare professional. [employer] disclaims any warranty or liability for your use of this information.

## 2019-03-17 NOTE — Unmapped (Signed)
Dermatology Clinic Visit      Assessment and Plan:  Hidradenitis Suppurativa, Hurley 2, improving on Cosentyx   - We discussed the typical natural history, pathogenesis, treatment options, and expected course as well as the relapsing and sometimes recalcitrant nature of the disease.    -Continue Skyrizi 300mg  q 4 weeks until we can figure out Cosentyx as possible next step (Rinvoq as backup).  Side effects were reviewed  -12/2018 started Cosentyx 300mg  week 0, 1, 2, 3, 4 then q 2 weeks. Continue q2 weeks, side effects reviewed  - finasteride 5mg  once daily, side effects reviewed  -for flaring areas on abdomen start Augmentin 300mg  po twice daily, side effects reviewed    RV: 3 months with Dr. Janyth Contes in [facility] clinic    ________________________________________________________________________________________________________    CC:  Hidradenitis Suppurativa    HPI:    Debbie Gregory is a friendly 29 y.o. female with a history of HS presenting for follow-up  She was started on infliximab in 12/2018 and titrated up to 10 mg/kg every 4 weeks.  This was very helpful in the beginning but efficacy waned and so we instead changed her over to [employer] in April 2020 with a dose of 200 mg/kg that did not help. Started Skyrizi 10/2018 and does not feel like it is helping much either.  Started Cosentyx in 12/2018 and feel like it has been very helfpul so far.  Still has some new areas arising on abdomen and slight drainage on the axillae.  Self-reported severity (0-5): 3  VAS pain today: 2  VAS average pain for the last month: 4  Requiring pain medication? Yes.  If so, what type/frequency? otc pain reliever ,heating pad ,vicks, epsom bath  How often in pain?  continuously  Level of odor (0-5): 2  Level of itching (0-5): 4  Dressing changes needed for drainage:Twice a day  How much drainage: some drainage  Flare in the last month (Y/N)? No.  How long ago was the last flare? in last 6 months  Developing new lesions? once a week Number of inflammatory lesions montly: 3-5  DLQI 15  Current treatment: COSENTYX     How helpful is the current treatment in managing the following aspects of your disease?  Not at all helpful Somewhat helpful Very helpful   Pain  *    Decreasing length of flares  *    Decreasing new lesions *     Drainage  *    Decreasing frequency of flares *     Decreasing severity of flares  *    Odor   *         Disease course:  Year when symptoms first noticed: Age 72-13  Year of diagnosis: 2007  Who diagnosed you? Dermatologist  Location of first symptoms: groin  Typical involved areas include: groin, underarms, under the breasts, inner thighs, buttocks, face, stomach, top of thighs  Typical number of inflammatory lesions each month at baseline (from first visit): 5-10  Disease triggers: stress, sweat, heat, exercise, menstrual cycle    Are menstrual cycles irregular when not on birth control? No.  Current form of contraception: Depo  Effect of hormonal contraception on disease: No change  Flaring with menstrual cycle (before, during, or after?): during  Difficulty becoming pregnancy? No.  Pregnancy complications? pre-eclampsia  How many children? 1  Better or worse with pregnancy?  worse.  If so, worse during a specific trimester? third      FH:  Patient Mother Father Son Daughter Brother Sister Maternal Grandmother Maternal Grandfather Paternal Grandmother Paternal Grandfather Maternal Aunt Maternal Uncle Paternal Aunt Paternal Uncle Other:   Derm Hidradenitis Suppurativa   x                                   Pilonidal sinus     x                                Acne                                      Dissecting cellulitis(Scalp)                                     Eczema         x                            Allergies                    Rheum Joint pains                                      SAPHO                                     Pyoderma gangrenosum                                     Back pain Auto-immune disease                                     Asthma                    Endo Polycystic ovarian syndrome                                     Thyroid disease  x                  Vitamin D deficiency  x                 Psych Anxiety                                     Depression                                     Dementia  Suicidal thoughts*                                   Cardio Hypertension                                     High cholesterol    x                                 Heart attack                                    Stroke                                     Hem-onc Cancer  ______________                                    Anemia  x                  GI IBD (UC/Crohn's)                                   ID HIV                     Syphilis                     Other                         Social History:  Current or former smoker? current  Amount smoking: less than half ppd  How many years: Not answered  ED visits in the last 5 years? never  Difficulty affording medications? never  Marital Status: single  Living with some one? Yes.    Prior treatments:  Topical: Did not list which ones  Systemic: Doxycycline,Minocycline, Clindamycin, Humira, Spirinolactone  Past surgical procedures: None  Past laser procedures: None        ROS:     The balance of 10 systems is negative unless otherwise documented.     OBJECTIVE:   Gen: Well-appearing patient, appropriate, interactive, in no acute distress  Skin: Examination of the scalp, face, neck, chest, back, abdomen, bilateral upper and lower extremities, hands, palms, soles, nails, buttocks, and external genitalia performed today and pertinent for:   ??  location Abscess Inflamed nodule Non-inflamed nodule Draining sinus Non-draining Sinus Hurley % scar   R axilla ?? ??1  ?? 1 ??2 ??   L axilla ?? 1 ?? 1 ?? ??2 ??   R inframammary ?? 1 ?? ?? ?? ?? ??   L inframammary ??  ?? ?? ?? ?? ??   Intermammary ?? ?? ?? ?? ?? ?? ??   Pubic ?? ??   R inguinal ?? ?? ?? ?? ??1 ?? ??   R thigh ?? ?? ?? ?? ?? ?? ??   L inguinal ?? ?? ?? ?? ??2 ??2 ??  L thigh ?? ?? ?? ?? ?? ?? ??   Scrotum/Vulva ?? ?? ?? ?? ?? ?? ??   Perianal ?? ?? ?? ?? ?? ?? ??   R buttock ?? ?? ?? ?? ??1 ?? ??   L buttock ?? ?? ?? ?? 1?? ?? ??   Other (list) ?? ?? ?? ?? ?? ?? ??   ??abdomen ?? 3 ?? ?? ?? ?? ??   ??    -sites not commented on demonstrate normal findings.    AN count (total sum of abscess and inflammatory nodule): 6  Pilonidal sinus (Y/N, or previously treated)? No.  Approximate BSA involved by inflammatory lesions: 0.5%  Intertriginous comedones: few  Diffuse comedones (trunk, face, etc): none  Acne scars: none  Cribriform scarring: No.  Intertrigionus epidermal inclusion cysts: 1-3  Diffuse (trunk, feace, extremities) epidermal inclusion cysts: 1-3  Regular phenotype

## 2019-04-08 NOTE — Unmapped (Signed)
[facility] Pharm[facility] Refill Coordination Note    Specialty Medication(s) to be Shipped:   Inflammatory Disorders: Cosentyx    Other medication(s) to be shipped: n/a     Debbie Gregory, DOB: [birth year]  Phone: [phone] (home)       All above HIPAA information was verified with patient.     Was a Nurse, learning disability used for this call? No    Completed refill call assessment today to schedule patient's medication shipment from the [facility] [phone]).       Specialty medication(s) and dose(s) confirmed: Regimen is correct and unchanged.   Changes to medications: Debbie Gregory reports no changes at this time.  Changes to insurance: No  Questions for the pharmacist: No    Confirmed patient received Welcome Packet with first shipment. The patient will receive a drug information handout for each medication shipped and additional FDA Medication Guides as required.       DISEASE/MEDICATION-SPECIFIC INFORMATION        For patients on injectable medications: Patient currently has 0 doses left.  Next injection is scheduled for 04/11/2019.    SPECIALTY MEDICATION ADHERENCE     Medication Adherence    Patient reported X missed doses in the last month: 0  Specialty Medication: Cosentyx 150 mg/ml  Patient is on additional specialty medications: No  Any gaps in refill history greater than 2 weeks in the last 3 months: no  Demonstrates understanding of importance of adherence: yes  Informant: patient  Reliability of informant: reliable  Confirmed plan for next specialty medication refill: delivery by pharmacy  Refills needed for supportive medications: not needed                Cosentyx 150 mg/ml. 0 on hand      SHIPPING     Shipping address confirmed in Epic.     Delivery Scheduled: Yes, Expected medication delivery date: 04/10/2019.     Medication will be delivered via UPS to the prescription address in Epic WAM.    Stoy Fenn D Caridad Silveira   [facility] Specialty Technician

## 2019-04-09 MED FILL — COSENTYX 300 MG/2 SYRINGES (150 MG/ML) SUBCUTANEOUS: 28 days supply | Qty: 4 | Fill #3 | Status: AC

## 2019-04-09 MED FILL — COSENTYX 300 MG/2 SYRINGES (150 MG/ML) SUBCUTANEOUS: SUBCUTANEOUS | 28 days supply | Qty: 4 | Fill #3

## 2019-04-30 DIAGNOSIS — L4050 Arthropathic psoriasis, unspecified: Principal | ICD-10-CM

## 2019-04-30 DIAGNOSIS — L409 Psoriasis, unspecified: Principal | ICD-10-CM

## 2019-04-30 MED ORDER — COSENTYX 300 MG/2 SYRINGES (150 MG/ML) SUBCUTANEOUS
SUBCUTANEOUS | 3 refills | 28.00000 days | Status: CP
Start: 2019-04-30 — End: ?
  Filled 2019-05-05: qty 4, 28d supply, fill #0

## 2019-04-30 NOTE — Unmapped (Signed)
Requesting refill on Cosentyx, Patient was last seen in the office on 03/17/19. Please advise, Thanks!

## 2019-04-30 NOTE — Unmapped (Signed)
[facility] Special[facility] Pharm[facility] Refill Coordination Note    Specialty Medication(s) to be Shipped:   Inflammatory Disorders: Cosentyx    Other medication(s) to be shipped: n/a     Debbie Gregory, DOB: 1990-10-05  Phone: [phone] (home)       All above HIPAA information was verified with patient.     Was a Nurse, learning disability used for this call? No    Completed refill call assessment today to schedule patient's medication shipment from the [facility] [phone]).       Specialty medication(s) and dose(s) confirmed: Regimen is correct and unchanged.   Changes to medications: Debbie Gregory reports no changes at this time.  Changes to insurance: No  Questions for the pharmacist: No    Confirmed patient received Welcome Packet with first shipment. The patient will receive a drug information handout for each medication shipped and additional FDA Medication Guides as required.       DISEASE/MEDICATION-SPECIFIC INFORMATION        For patients on injectable medications: Patient currently has 0 doses left.  Next injection is scheduled for 05/09/2019.    SPECIALTY MEDICATION ADHERENCE     Medication Adherence    Patient reported X missed doses in the last month: 0  Specialty Medication: Cosentyx 150 mg/ml  Patient is on additional specialty medications: No  Any gaps in refill history greater than 2 weeks in the last 3 months: no  Demonstrates understanding of importance of adherence: yes  Informant: patient  Reliability of informant: reliable  Confirmed plan for next specialty medication refill: delivery by pharmacy  Refills needed for supportive medications: not needed                Cosentyx 150 mg/ml. 0 on hand      SHIPPING     Shipping address confirmed in Epic.     Delivery Scheduled: Yes, Expected medication delivery date: 05/06/2019.  However, Rx request for refills was sent to the provider as there are none remaining.     Medication will be delivered via UPS to the prescription address in Epic WAM.    Debbie Gregory   [facility] Specialty Technician

## 2019-05-05 MED FILL — COSENTYX 300 MG/2 SYRINGES (150 MG/ML) SUBCUTANEOUS: 28 days supply | Qty: 4 | Fill #0 | Status: AC

## 2019-05-27 NOTE — Unmapped (Signed)
[facility] Pharm[facility] Refill Coordination Note    Specialty Medication(s) to be Shipped:   Inflammatory Disorders: Cosentyx    Other medication(s) to be shipped:       Debbie Gregory, DOB: April 28, 1990  Phone: [phone] (home)       All above HIPAA information was verified with patient.     Was a Nurse, learning disability used for this call? No    Completed refill call assessment today to schedule patient's medication shipment from the [facility] [phone]).       Specialty medication(s) and dose(s) confirmed: Regimen is correct and unchanged.   Changes to medications: [name] reports no changes at this time.  Changes to insurance: No  Questions for the pharmacist: No    Confirmed patient received Welcome Packet with first shipment. The patient will receive a drug information handout for each medication shipped and additional FDA Medication Guides as required.       DISEASE/MEDICATION-SPECIFIC INFORMATION        For patients on injectable medications: Patient currently has 1 doses left.  Next injection is scheduled for 05/21.    SPECIALTY MEDICATION ADHERENCE     Medication Adherence    Patient reported X missed doses in the last month: 0  Specialty Medication: Cosentyx 150 mg/ml  Patient is on additional specialty medications: No  Informant: patient  Reliability of informant: reliable  Patient is at risk for Non-Adherence: No                Cosentyx 150 mg/ml: 14 days of medicine on hand         SHIPPING     Shipping address confirmed in Epic.     Delivery Scheduled: Yes, Expected medication delivery date: 05/26.     Medication will be delivered via UPS to the prescription address in Epic WAM.    [name]   [facility] Specialty Technician

## 2019-06-03 MED FILL — COSENTYX 300 MG/2 SYRINGES (150 MG/ML) SUBCUTANEOUS: 28 days supply | Qty: 4 | Fill #1 | Status: AC

## 2019-06-03 MED FILL — COSENTYX 300 MG/2 SYRINGES (150 MG/ML) SUBCUTANEOUS: SUBCUTANEOUS | 28 days supply | Qty: 4 | Fill #1

## 2019-06-16 ENCOUNTER — Ambulatory Visit: Admit: 2019-06-16 | Discharge: 2019-06-17 | Payer: PRIVATE HEALTH INSURANCE

## 2019-06-16 NOTE — Unmapped (Signed)

## 2019-06-16 NOTE — Unmapped (Signed)
Dermatology Clinic Visit      Assessment and Plan:  Hidradenitis Suppurativa, Hurley 2, improving on Cosentyx   - We discussed the typical natural history, pathogenesis, treatment options, and expected course as well as the relapsing and sometimes recalcitrant nature of the disease.    -12/2018 started Cosentyx 300mg  week 0, 1, 2, 3, 4 then q 2 weeks. Continue q2 weeks, side effects reviewed  - finasteride 5mg  once daily, side effects reviewed  -may consider some deroofings of axillae in the future (~4cm)    RV: 4-5 months with Dr. Janyth Contes in [facility] clinic    ________________________________________________________________________________________________________    CC:  Hidradenitis Suppurativa    HPI:    Debbie Gregory is a friendly 29 y.o. female with a history of HS presenting for follow-up, last seen 03/2019.  Started Cosentyx in 12/2018 and has noted steady improvement.  Much less pain, though has a few areas that still have some drainage under the arm.  Few new lesions other than a recent one on the abdomen.    Recent disease course: She was started on infliximab in 12/2017 and titrated up to 10 mg/kg every 4 weeks.  This was very helpful in the beginning but efficacy waned and so we instead changed her over to [employer] in April 2020 with a dose of 200 mg/kg that did not help. Started Skyrizi 10/2018 and does not feel like it is helping much either.  Started Cosentyx in 12/2018     Disease course:  Year when symptoms first noticed: Age 36-13  Year of diagnosis: 2007  Who diagnosed you? Dermatologist  Location of first symptoms: groin  Typical involved areas include: groin, underarms, under the breasts, inner thighs, buttocks, face, stomach, top of thighs  Typical number of inflammatory lesions each month at baseline (from first visit): 5-10  Disease triggers: stress, sweat, heat, exercise, menstrual cycle    Are menstrual cycles irregular when not on birth control? No.  Current form of contraception: Depo  Effect of hormonal contraception on disease: No change  Flaring with menstrual cycle (before, during, or after?): during  Difficulty becoming pregnancy? No.  Pregnancy complications? pre-eclampsia  How many children? 1  Better or worse with pregnancy?  worse.  If so, worse during a specific trimester? third      FH:      Patient Mother Father Son Daughter Brother Sister Maternal Grandmother Maternal Grandfather Paternal Grandmother Paternal Grandfather Maternal Aunt Maternal Uncle Paternal Aunt Paternal Uncle Other:   Derm Hidradenitis Suppurativa   x                                   Pilonidal sinus     x                                Acne                                      Dissecting cellulitis(Scalp)                                     Eczema         x  Allergies                    Rheum Joint pains                                      SAPHO                                     Pyoderma gangrenosum                                     Back pain                                    Auto-immune disease                                     Asthma                    Endo Polycystic ovarian syndrome                                     Thyroid disease  x                  Vitamin D deficiency  x                 Psych Anxiety                                     Depression                                     Dementia                                     Suicidal thoughts*                                   Cardio Hypertension                                     High cholesterol    x                                 Heart attack                                    Stroke  Hem-onc Cancer  ______________                                    Anemia  x                  GI IBD (UC/Crohn's)                                   ID HIV                     Syphilis                     Other                         Social History:  Current or former smoker? current  Amount smoking: less than half ppd  How many years: Not answered  ED visits in the last 5 years? never  Difficulty affording medications? never  Marital Status: single  Living with some one? Yes.    Prior treatments:  Topical: Did not list which ones  Systemic: Doxycycline,Minocycline, Clindamycin, Humira, Spirinolactone  Past surgical procedures: None  Past laser procedures: None        ROS:     The balance of 10 systems is negative unless otherwise documented.     OBJECTIVE:   Gen: Well-appearing patient, appropriate, interactive, in no acute distress  Skin: Examination of the scalp, face, neck, chest, back, abdomen, bilateral upper and lower extremities, hands, palms, soles, nails, buttocks, and external genitalia performed today and pertinent for:   ??  location Abscess Inflamed nodule Non-inflamed nodule Draining sinus Non-draining Sinus Hurley % scar   R axilla ?? ??  ?? 1 ??2 ??   L axilla ?? 1 ??  ??1 ??2 ??   R inframammary ??  ?? ?? ?? ?? ??   L inframammary ??  ?? ?? ?? ?? ??   Intermammary ?? ?? ?? ?? ?? ?? ??   Pubic ?? ?? ?? ?? ?? ?? ??   R inguinal ?? ?? ?? ?? ??1 ?? ??   R thigh ?? ?? ?? ?? ?? ?? ??   L inguinal ?? ?? ?? ?? ??2 ??2 ??   L thigh ?? ?? ?? ?? ?? ?? ??   Scrotum/Vulva ?? ?? ?? ?? ?? ?? ??   Perianal ?? ?? ?? ?? ?? ?? ??   R buttock ?? ?? ?? ?? ??1 ?? ??   L buttock ?? ?? ?? ?? 1?? ?? ??   Other (list) ?? ?? ?? ?? ?? ?? ??   ??abdomen ?? 1 ?? ?? ?? ?? ??   ??    -sites not commented on demonstrate normal findings.    AN count (total sum of abscess and inflammatory nodule): 2  Pilonidal sinus (Y/N, or previously treated)? No.  Approximate BSA involved by inflammatory lesions: 0.2%  Intertriginous comedones: few  Diffuse comedones (trunk, face, etc): none  Acne scars: none  Cribriform scarring: No.  Intertrigionus epidermal inclusion cysts: 1-3  Diffuse (trunk, feace, extremities) epidermal inclusion cysts: 1-3  Regular phenotype

## 2019-06-23 NOTE — Unmapped (Signed)
[facility] Clinical Assessment & Refill Coordination Note    Debbie Gregory, DOB: [birth year]  Phone: [phone] (home)     All above HIPAA information was verified with patient.     Was a Nurse, learning disability used for this call? No    Specialty Medication(s):   Inflammatory Disorders: Cosentyx     Current Outpatient Medications   Medication Sig Dispense Refill   ??? finasteride (PROSCAR) 5 mg tablet Take 1 tablet (5 mg total) by mouth daily. 90 tablet 3   ??? medroxyPROGESTERone (DEPO-PROVERA) 150 mg/mL injection Inject into the muscle.     ??? nystatin (MYCOSTATIN) 100,000 unit/gram powder Apply twice daily as needed 60 g 11   ??? propranolol (INDERAL) 20 MG tablet TAKE 1 TABLET BY MOUTH EVERY DAY     ??? secukinumab (COSENTYX) 150 mg/mL PnIj injection Inject the contents of 2 pens (300 mg total) under the skin once a week. For the first 5 doses. 10 mL 0   ??? secukinumab (COSENTYX, 2 SYRINGES,) 150 mg/mL Syrg Inject the contents of 2 syringes (300 mg) under the skin every fourteen (14) days. 4 mL 3     No current facility-administered medications for this visit.        Changes to medications: Karstyn reports no changes at this time.    No Known Allergies    Changes to allergies: No    SPECIALTY MEDICATION ADHERENCE     Cosentyx 1500 mg/ml: 25 days of medicine on hand       Medication Adherence    Patient reported X missed doses in the last month: 0  Specialty Medication: Cosentyx 150 mg/ml  Informant: patient  Confirmed plan for next specialty medication refill: delivery by pharmacy  Refills needed for supportive medications: not needed          Specialty medication(s) dose(s) confirmed: Regimen is correct and unchanged.     Are there any concerns with adherence? No    Adherence counseling provided? Not needed    CLINICAL MANAGEMENT AND INTERVENTION      Clinical Benefit Assessment:    Do you feel the medicine is effective or helping your condition? Yes    Clinical Benefit counseling provided? Progress note from 06/16/19 shows evidence of clinical benefit    Adverse Effects Assessment:    Are you experiencing any side effects? No    Are you experiencing difficulty administering your medicine? No    Quality of Life Assessment:    How many days over the past month did your HS  keep you from your normal activities? For example, brushing your teeth or getting up in the morning. 0    Have you discussed this with your provider? Not needed    Therapy Appropriateness:    Is therapy appropriate? Yes, therapy is appropriate and should be continued    DISEASE/MEDICATION-SPECIFIC INFORMATION      For patients on injectable medications: Patient currently has 1 doses left.  Next injection is scheduled for 07/04/19.    PATIENT SPECIFIC NEEDS     - Does the patient have any physical, cognitive, or cultural barriers? No    - Is the patient high risk? No     - Does the patient require a Care Management Plan? No     - Does the patient require physician intervention or other additional services (i.e. nutrition, smoking cessation, social work)? No      SHIPPING     Specialty Medication(s) to be Shipped:  Inflammatory Disorders: Cosentyx    Other medication(s) to be shipped: n/a     Changes to insurance: No    Delivery Scheduled: Yes, Expected medication delivery date: 07/08/19.     Medication will be delivered via UPS to the confirmed prescription address in [facility].    The patient will receive a drug information handout for each medication shipped and additional FDA Medication Guides as required.  Verified that patient has previously received a Conservation officer, historic buildings.    All of the patient's questions and concerns have been addressed.    Everest Hacking Vangie Bicker   [facility] Specialty Pharmacist

## 2019-07-07 MED FILL — COSENTYX 300 MG/2 SYRINGES (150 MG/ML) SUBCUTANEOUS: SUBCUTANEOUS | 28 days supply | Qty: 4 | Fill #2

## 2019-07-07 MED FILL — COSENTYX 300 MG/2 SYRINGES (150 MG/ML) SUBCUTANEOUS: 28 days supply | Qty: 4 | Fill #2 | Status: AC

## 2019-07-17 MED ORDER — CLOBETASOL 0.05 % TOPICAL OINTMENT
Freq: Two times a day (BID) | TOPICAL | 5 refills | 0 days | Status: CP
Start: 2019-07-17 — End: ?

## 2019-08-04 NOTE — Unmapped (Signed)
[facility] Refill Coordination Note    Specialty Medication(s) to be Shipped:   Inflammatory Disorders: Cosentyx    Other medication(s) to be shipped: No additional medications requested for fill at this time     Debbie Gregory, DOB: [birth year]  Phone: [phone] (home)       All above HIPAA information was verified with patient.     Was a Nurse, learning disability used for this call? No    Completed refill call assessment today to schedule patient's medication shipment from the [facility] [phone]).       Specialty medication(s) and dose(s) confirmed: Regimen is correct and unchanged.   Changes to medications: Arretta reports no changes at this time.  Changes to insurance: No  Questions for the pharmacist: No    Confirmed patient received Welcome Packet with first shipment. The patient will receive a drug information handout for each medication shipped and additional FDA Medication Guides as required.       DISEASE/MEDICATION-SPECIFIC INFORMATION        For patients on injectable medications: Patient currently has 0 doses left.  Next injection is scheduled for 08/15/2019.    SPECIALTY MEDICATION ADHERENCE     Medication Adherence    Patient reported X missed doses in the last month: 0  Specialty Medication: Cosentyx 150 mg/ml  Patient is on additional specialty medications: No  Any gaps in refill history greater than 2 weeks in the last 3 months: no  Demonstrates understanding of importance of adherence: yes  Informant: patient  Reliability of informant: reliable  Confirmed plan for next specialty medication refill: delivery by pharmacy  Refills needed for supportive medications: not needed                      SHIPPING     Shipping address confirmed in Epic.     Delivery Scheduled: Yes, Expected medication delivery date: 08/07/2019.     Medication will be delivered via UPS to the prescription address in Epic WAM.    Fransico Sciandra D Kaysie Michelini   [facility] Shared [facility] Specialty Technician

## 2019-08-06 MED FILL — COSENTYX 300 MG/2 SYRINGES (150 MG/ML) SUBCUTANEOUS: SUBCUTANEOUS | 28 days supply | Qty: 4 | Fill #3

## 2019-08-06 MED FILL — COSENTYX 300 MG/2 SYRINGES (150 MG/ML) SUBCUTANEOUS: 28 days supply | Qty: 4 | Fill #3 | Status: AC

## 2019-08-28 DIAGNOSIS — L409 Psoriasis, unspecified: Principal | ICD-10-CM

## 2019-08-28 DIAGNOSIS — L4050 Arthropathic psoriasis, unspecified: Principal | ICD-10-CM

## 2019-08-28 MED ORDER — COSENTYX 300 MG/2 SYRINGES (150 MG/ML) SUBCUTANEOUS
SUBCUTANEOUS | 3 refills | 28.00000 days
Start: 2019-08-28 — End: ?

## 2019-08-28 NOTE — Unmapped (Signed)
Rx request cosentyx  Last OV: 03/17/19  Rx pending for approval

## 2019-08-28 NOTE — Unmapped (Signed)
[facility] Refill Coordination Note    Specialty Medication(s) to be Shipped:   Inflammatory Disorders: Cosentyx    Other medication(s) to be shipped: No additional medications requested for fill at this time     Debbie Gregory, DOB: 27-Feb-1990  Phone: [phone] (home)       All above HIPAA information was verified with patient.     Was a Nurse, learning disability used for this call? No    Completed refill call assessment today to schedule patient's medication shipment from the [facility] [phone]).       Specialty medication(s) and dose(s) confirmed: Regimen is correct and unchanged.   Changes to medications: Debbie Gregory reports no changes at this time.  Changes to insurance: No  Questions for the pharmacist: No    Confirmed patient received Welcome Packet with first shipment. The patient will receive a drug information handout for each medication shipped and additional FDA Medication Guides as required.       DISEASE/MEDICATION-SPECIFIC INFORMATION        For patients on injectable medications: Patient currently has 0 doses left.  Next injection is scheduled for 09/12/2019.    SPECIALTY MEDICATION ADHERENCE     Medication Adherence    Patient reported X missed doses in the last month: 0  Specialty Medication: Cosentyx 150 mg/ml   Patient is on additional specialty medications: No  Any gaps in refill history greater than 2 weeks in the last 3 months: no  Demonstrates understanding of importance of adherence: yes  Informant: patient  Reliability of informant: reliable  Confirmed plan for next specialty medication refill: delivery by pharmacy  Refills needed for supportive medications: not needed                      SHIPPING     Shipping address confirmed in Epic.     Delivery Scheduled: Yes, Expected medication delivery date: 09/04/2019.  However, Rx request for refills was sent to the provider as there are none remaining.     Medication will be delivered via UPS to the prescription address in Epic WAM.    Debbie Gregory   [facility] Specialty Technician

## 2019-08-29 MED ORDER — COSENTYX 300 MG/2 SYRINGES (150 MG/ML) SUBCUTANEOUS
SUBCUTANEOUS | 3 refills | 28.00000 days | Status: CP
Start: 2019-08-29 — End: ?
  Filled 2019-09-03: qty 4, 28d supply, fill #0

## 2019-09-03 MED FILL — COSENTYX 300 MG/2 SYRINGES (150 MG/ML) SUBCUTANEOUS: 28 days supply | Qty: 4 | Fill #0 | Status: AC

## 2019-09-26 NOTE — Unmapped (Signed)
[facility] Refill Coordination Note    Specialty Medication(s) to be Shipped:   Inflammatory Disorders: Cosentyx    Other medication(s) to be shipped: No additional medications requested for fill at this time     Debbie Gregory, DOB: [birth year]  Phone: [phone] (home)       All above HIPAA information was verified with patient.     Was a Nurse, learning disability used for this call? No    Completed refill call assessment today to schedule patient's medication shipment from the [facility] [phone]).       Specialty medication(s) and dose(s) confirmed: Regimen is correct and unchanged.   Changes to medications: Aubrionna reports no changes at this time.  Changes to insurance: No  Questions for the pharmacist: No    Confirmed patient received Welcome Packet with first shipment. The patient will receive a drug information handout for each medication shipped and additional FDA Medication Guides as required.       DISEASE/MEDICATION-SPECIFIC INFORMATION        For patients on injectable medications: Patient currently has 1 doses left.  Next injection is scheduled for 10/03/19.    SPECIALTY MEDICATION ADHERENCE     Medication Adherence    Patient reported X missed doses in the last month: 0  Specialty Medication: Cosentyx 150 mg/ml   Patient is on additional specialty medications: No  Informant: patient                  SHIPPING     Shipping address confirmed in Epic.     Delivery Scheduled: Yes, Expected medication delivery date: 09/30/19.     Medication will be delivered via UPS to the prescription address in Epic WAM.    Jasper Loser   [facility] Specialty Technician

## 2019-09-29 MED FILL — COSENTYX 300 MG/2 SYRINGES (150 MG/ML) SUBCUTANEOUS: 28 days supply | Qty: 4 | Fill #1 | Status: AC

## 2019-09-29 MED FILL — COSENTYX 300 MG/2 SYRINGES (150 MG/ML) SUBCUTANEOUS: SUBCUTANEOUS | 28 days supply | Qty: 4 | Fill #1

## 2019-10-20 ENCOUNTER — Ambulatory Visit: Admit: 2019-10-20 | Discharge: 2019-10-21 | Payer: PRIVATE HEALTH INSURANCE

## 2019-10-20 DIAGNOSIS — L732 Hidradenitis suppurativa: Principal | ICD-10-CM

## 2019-10-20 DIAGNOSIS — L409 Psoriasis, unspecified: Principal | ICD-10-CM

## 2019-10-20 DIAGNOSIS — Z79899 Other long term (current) drug therapy: Principal | ICD-10-CM

## 2019-10-20 MED ORDER — BRODALUMAB 210 MG/1.5 ML SUBCUTANEOUS SYRINGE: 210 mg | mL | 11 refills | 0 days | Status: AC

## 2019-10-20 MED ORDER — BRODALUMAB 210 MG/1.5 ML SUBCUTANEOUS SYRINGE
SUBCUTANEOUS | 0 refills | 0.00000 days | Status: CP
Start: 2019-10-20 — End: ?
  Filled 2019-12-17: qty 3, 14d supply, fill #0

## 2019-10-20 NOTE — Unmapped (Addendum)
For HS:  - Start Siliq as directed  - Will message you via MyChart with consent information    Hidradentis Suppurativa (pronounced ???high-drad-en-eye-tis/sup-your-uh-tee-vah???) is a chronic disease of hair follicles.  The lesions occur most commonly on areas of skin-to-skin contact: under the arms (axillary area), in the groin, around the buttocks, in the region around the anus and genitals, and on the skin between and under the breasts. In women, the underarms, groin, and breast areas are most commonly affected. Men most often have HS lesions around the anus and under the arms and may also have HS at the back of the neck and behind and around the ears.  ??  What does HS look and feel like?   The first thing that someone with HS notices is a tender, raised, red bump that looks like an under-the-skin pimple or boil. Sometimes HS lesions have two or more ???heads.???  In mild disease only an occasional boil or abscess may occur, but in more active disease there can be many new lesions every month.  Some abscesses can become larger and may open and drain pus.  Bleeding and increased odor can also occur. In severe disease, deeper abscesses develop and may connect with each other under the skin to form tunnel-like tracts (sinuses, fistulas).  These may drain constantly, or may temporarily improve and then usually begin draining again over time.  In people who have had sinus tracts for some time, scars form that feel like ropes under the skin. In the very worst cases, networks of sinus tracts can form deeper in the body, including the muscle and other tissues. Many people with severe HS have scars that can limit their ability to freely move their arms or legs, though this is very unlikely for most patients.   ??  Clinicians usually classify or ???grade??? HS using the [facility] staging system according to the severity of the disease for each body location:  ??? Hurley stage I: one or more abscesses are present, but no sinus tracts have formed and no scars have developed  ??? Hurley stage II: one or more abscesses are present that resolve and recur; on sinus tract can be present and scarring is seen  ??? Hurley stage III: many abscesses and more than one sinus tract is present with extensive scars.  ??  What causes HS?  The cause of HS is not completely understood.  It seems to be a disorder of hair follicles and often many family members are affected so genetics probably play a strong role.  Bacteria are often present and may make the disease worse, but infection does not seem to be the main cause. Hormones are also likely play a role since the condition typically starts around puberty when hair follicles under the arms and in the groin start to change.  It can sometimes flare with menstrual cycles in women as well.  In most cases it lasts for decades and starts to improve to some extent in the late 30s and 40s as long as many fistulas have not already formed.  Women are three times more likely than men to develop HS.  ??  Other factors are known to contribute to HS flaring or becoming worse, though they are likely not the main causes. The factors most commonly associated with HS include:  ??? Cigarette smoking - this is very highly linked.  Stopping smoking will likely not cure the disease, but likely is helpful in reducing how much and how often it  flares.  ??? Obesity - HS may occur even in people that are not overweight, but it is much more common in patients that are.  There is some evidence that losing weight and eating a diet low in sugars and fats may be helpful in improving hidradenitis, though this is not helpful for everyone.  Working with a nutritionist may be an important way to help with this and is something your physician can help coordinate  ??  Hidradenitis is not contagious.  It is not caused by a problem with personal hygiene or any other activity or behavior of those with the disease.  ??  How can your doctor help you treat your hidradenitis?  Clinicians use both medication and surgery to treat HS. The choice of treatment???or combination of treatments???is made according to an individual patient???s needs. Clinicians consider several factors in determining the most appropriate plan for therapy:  ??? Severity of disease - medications and some laser treatments are usually able to control disease best when fistulas are not present.  Fistulas typically require surgery.  ??? Extent and location of disease  ??? Chronicity (how often the lesions recur)  ??  A number of different surgical methods have been developed that are useful for certain patients under particular circumstances. These can be done with local numbing and healing at home for some areas when disease is not too extensive with relatively brief recovery times.  In more extensive disease there may be a need for larger excisions under general anesthesia with healing time in the hospital and prolonged recovery periods for better disease control.    ??  In addition, many medical treatments have been tried???some with more success than others. No medication is effective for all patients, and you and your doctor may have to try several different agents or combinations of agents before you find the treatment plan that works best for you.  The goals of therapy with medications that are either topical (used on the skin) or systemic (taken by mouth) are:  1. to clear the lesions or at least reduce their number and extent, and  2. to prevent new lesions from forming.  3. To reduce pain, drainage, and odor  Some of the types of medications commonly used are antibacterial skin washes and the topical antibiotics to prevent secondary infections and corticosteroid injections into the lesions to reduce inflammation.   ??  Other medications that may be used include retinoids (similar to Accutane), drugs that effect how hormones and hair follicles interact, drugs that affect your immune system (such as methotrexate, adalimumab/Humira, and Remicaid/infliximab), steroids, and oral antibiotics.  ??  Lasers that destroy hair follicles can also be helpful since they reduce the hair follicles that cause the problems.  Multiple treatments are typically required over time and there is some discomfort associated with treatment, but it is typically very fast and well-tolerated.  ??  It is very important to realize that hidradenitis cannot be completely cured with any single medication or surgical procedure.  It is a disease that can be very stubborn and difficult to control, but with good treatment a lot of improvement and sometimes temporary remissions can be obtained. Poorly controlled disease can cause more fistulas to form and make managing the disease much more difficult over time so it is important to seek care to reduce major flares.  Surgery can provide a long term cure in some areas, though the disease can start again or continue in nearby areas.  A dermatologist is  often the best person to help coordinate disease treatment, and sometimes other surgeons, pain specialists, other specialists, and nutritionists may be part of the treatment team.  ??  What can you do to help your HS?  1. If your are a smoker, then stopping can probably be helpful.  Your dermatologist will be happy to refer you to some one who can help with this.  2. Follow a healthy diet and try to achieve a healthy weight  Some other self-help measures are:  ??? Keep your skin cool and dry (becoming overheated and sweating can contribute to an HS flare)  ??? To reduce the pain of cysts or nodules or to help them to drain, apply hot compresses or soak in hot water for 10 minutes at a time (use a clean washcloth or a teabag soaked in hot water)  ??? For female patients, cotton underwear that does not have tight elastic in the groin can be helpful.  Boyshort, brief, or boxer style underwear may be a better option as friction on hair follicles in affected areas can be a major trigger in some patients.  These can be easily found on [place] or with some retailers.  Fruit of the Loom and Underworks are two brands that are sometimes recommended.  ??  Finally, know that you are not alone. Coping with the pain and other symptoms of HS can be very difficult, so it may be helpful to connect with others who live with HS. Patient groups and networks can be sources of important information and support. Some internet resources for information and connections are provided below.  Resources for Information  ??  The Hidradenitis Suppurativa Foundation: A nonprofit organized by a group of physicians interested in treating and advancing research in hidradenitis suppurativa  ??  American Academy of Dermatology  ARanked.fi  ??  [employer]  ElevatorPitchers.de.html  [employer]ORD: [employer] for Rare [employer]  https://www.rarediseases.org/rare-disease-information/rare-diseases/byID/358/viewAbstract  Trials of new medications for HS  https://www.clinicaltrials.gov

## 2019-10-20 NOTE — Unmapped (Signed)
Dermatology Note     Assessment and Plan:      Hidradenitis Suppurativa, Hurley 2, flaring on Cosentyx   - We discussed the typical natural history, pathogenesis, treatment options, and expected course as well as the relapsing and sometimes recalcitrant nature of the disease.  - Given GI issues, patient would like to avoid antibiotics  - Patient self-discontinued finasteride 5mg  once daily, side effects reviewed  - Discussed treatment options including increasing frequency of Cosentyx vs starting Siliq  - R/B/A of Siliq reviewed including mood swings  Jointly elect to proceed as follows:  - Stop Cosentyx 300mg  week 0, 1, 2, 3, 4 then q 2 weeks. Continue q2 weeks, side effects reviewed (started 12/2018)  - Start brodalumab 210 mg/1.5 mL Syrg; Loading doses 1 injection week 0, 1, and 2. Then inject 210 mg under the skin every fourteen (14) days as a maintenance dose  - May consider some deroofings of axillae in the future (~4cm)    The patient was advised to call for an appointment should any new, changing, or symptomatic lesions develop.     RTC: Return in about 3 months (around 01/20/2020) for Follow up of HS. or sooner as needed   _________________________________________________________________      Chief Complaint     Chief Complaint   Patient presents with   ??? Follow-up     HS-FLARE ON LEGS GROIN STOMACH BREAST AND ARMPITS-COSENTYX-FLARED FOR     HPI     Debbie Gregory is a 29 y.o. female who presents as a returning patient (last seen by Dr. Janyth Contes on 06/16/2019) to [facility] for follow up of hidradenitis suppurativa. At last visit, the patient continued bi-weekly Cosentyx 300 mg. Today she reports a flare on her armpits, groin, R breast and stomach, despite treating with Cosentyx. She states that flares are less frequent and less severe after starting Cosentyx, but the new lesions arising are very frustrating and painful. Patient reports she is currently not treating with finasteride. She notes that the same active areas on her groin continue to return. Due to stomach issues, she states she would like to avoid antibiotics.    Self-reported severity (0-5): 3  VAS pain today: 3  VAS average pain for the last month: 4  Requiring pain medication? Yes.  If so, what type/frequency? Heating pads, Ibuprofen   How often in pain?  Few times a day   Level of odor (0-5): 2   Level of itching (0-5): 3  Dressing changes needed for drainage:Once a day  How much drainage: a little drainage   Flare in the last month (Y/N)? No.  How long ago was the last flare? Within the last year   Developing new lesions? Monthly   Number of inflammatory lesions montly: 10 or more   DLQI:14  Current treatment: Cosentyx     How helpful is the current treatment in managing the following aspects of your disease?  Not at all helpful Somewhat helpful Very helpful   Pain ?? * ??   Decreasing length of flares * ?? ??   Decreasing new lesions * ?? ??   Drainage ?? * ??   Decreasing frequency of flares ?? * ??   Decreasing severity of flares ?? * ??   Odor ?? * ??   ??  The patient denies any other new or changing lesions or areas of concern.     Pertinent Past Medical History     FH:  Patient Mother Father Son Daughter Brother Sister Maternal Grandmother Maternal Grandfather Paternal Grandmother Paternal Grandfather Maternal Aunt Maternal Uncle Paternal Aunt Paternal Uncle Other:   Derm Hidradenitis Suppurativa   x                                   Pilonidal sinus     x                                Acne                                      Dissecting cellulitis(Scalp)                                     Eczema         x                             Allergies                                    Rheum Joint pains                                      SAPHO                                     Pyoderma gangrenosum                                     Back pain                                     Auto-immune disease                                      Asthma Endo Polycystic ovarian syndrome                                      Thyroid disease   x                                 Vitamin D deficiency   x                               Psych Anxiety                                     Depression  Dementia                                     Suicidal thoughts*                                   Cardio Hypertension                                     High cholesterol    x                                 Heart attack                                     Stroke                                     Hem-onc Cancer  ______________                                     Anemia  x                                 GI IBD (UC/Crohn's)                                   ID HIV                                      Syphilis                                      Other                                            Social History:  Current or former smoker? current  Amount smoking: less than half ppd  How many years: Not answered  ED visits in the last 5 years? never  Difficulty affording medications? never  Marital Status: single  Living with some one? Yes.     Prior treatments:  Topical: Did not list which ones  Systemic: Doxycycline,Minocycline, Clindamycin, Humira, Spironolactone, Cosentyx  Past surgical procedures: None  Past laser procedures: None      Physical Examination     OBJECTIVE:   Gen: Well-appearing patient, appropriate, interactive, in no acute distress  Skin: Examination of the scalp, face, neck, chest, back, abdomen, bilateral upper and lower extremities, hands, palms, soles, nails, buttocks, and external genitalia performed today and pertinent for:     location Abscess Inflamed nodule Non-inflamed nodule Draining sinus Non-draining Sinus Hurley BSA  at site Color change  Inflamed induration Open skin surface  Tunnels   R axilla  1  1 1          L axilla  1  1          R inframammary 1             L inframammary Intermammary              Pubic              R inguinal    1          R thigh              L inguinal    1          L thigh              Scrotum/Vulva              Perianal              R buttock              L buttock              Other (list)              Abdomen  2  1          *0=none, 1=mild, 2=moderate, 3=severe    AN count (total sum of abscess and inflammatory nodule): 5    Scribe's Attestation: Elsie Stain, MD obtained and performed the history, physical exam and medical decision making elements that were entered into the chart.  Signed by Mable Fill, Scribe, on October 20, 2019 at 1:19 PM.  ----------------------------------------------------------------------------------------------------------------------  October 20, 2019 8:31 PM. Documentation assistance provided by the Scribe. I was present during the time the encounter was recorded. The information recorded by the Scribe was done at my direction and has been reviewed and validated by me.  ----------------------------------------------------------------------------------------------------------------------       (Approved Template 09/22/2019)

## 2019-10-20 NOTE — Unmapped (Deleted)
Self-reported severity (0-5): 3  VAS pain today: 3  VAS average pain for the last month: 4  Requiring pain medication? Yes.  If so, what type/frequency? Heating pads, Ibuprofen   How often in pain?  Few times a day   Level of odor (0-5): 2   Level of itching (0-5): 3  Dressing changes needed for drainage:Once a day  How much drainage: a little drainage   Flare in the last month (Y/N)? No.  How long ago was the last flare? Within the last year   Developing new lesions? Monthly   Number of inflammatory lesions montly: 10 or more   DLQI:14  Current treatment: ***     How helpful is the current treatment in managing the following aspects of your disease?  Not at all helpful Somewhat helpful Very helpful   Pain  *    Decreasing length of flares *     Decreasing new lesions *     Drainage  *    Decreasing frequency of flares  *    Decreasing severity of flares  *    Odor  *

## 2019-10-26 DIAGNOSIS — L409 Psoriasis, unspecified: Principal | ICD-10-CM

## 2019-11-06 NOTE — Unmapped (Signed)
Patient is switching to Siliq (currently in appeals process with insurance). Cosentyx prescription has been discontinued by Dr Janyth Contes. Patient will be on-boarded by pharmacist once Siliq has been approved.

## 2019-11-10 DIAGNOSIS — L4050 Arthropathic psoriasis, unspecified: Principal | ICD-10-CM

## 2019-11-10 DIAGNOSIS — L409 Psoriasis, unspecified: Principal | ICD-10-CM

## 2019-11-10 MED ORDER — COSENTYX 300 MG/2 SYRINGES (150 MG/ML) SUBCUTANEOUS
SUBCUTANEOUS | 3 refills | 28.00000 days | Status: CN
Start: 2019-11-10 — End: ?

## 2019-11-10 NOTE — Unmapped (Signed)
Update : [facility] is working to have Siliq delivered from supplier.  I have sent a request to Dr. Janyth Contes for a fill of Cosentyx to avoid lapse in therapy.  Patient is aware of the situation and would like to have either medicine for her Friday dose.  We will update her before Wednesday.    Debbie Gregory Vangie Bicker, PharmD  [facility]

## 2019-11-10 NOTE — Unmapped (Signed)
[facility] Specialty Medication Onboarding    Specialty Medication: Siliq  Prior Authorization: Approved   Financial Assistance: No - copay  <$25  Final Copay/Day Supply: $3 each / 28 days each for loading and maintenance doses    Insurance Restrictions: Yes - max 1 month supply     Notes to Pharmacist:     The triage team has completed the benefits investigation and has determined that the patient is able to fill this medication at [facility]. Please contact the patient to complete the onboarding or follow up with the prescribing physician as needed.

## 2019-11-10 NOTE — Unmapped (Signed)
[facility]   Patient Onboarding/Medication Counseling    Debbie Gregory is a 29 y.o. female with Hidradenitis Suppurativa who I am counseling today on initiation of therapy.  I am speaking to the patient.    Was a Nurse, learning disability used for this call? No    Verified patient's date of birth / HIPAA.    Specialty medication(s) to be sent: Inflammatory Disorders: Siliq      Non-specialty medications/supplies to be sent: n/a      Medications not needed at this time: n/a       The patient declined counseling on medication administration and storage, handling precautions, and disposal because they have taken the medication previously. The information in the declined sections below are for informational purposes only and was not discussed with patient.       Siliq (bordalumab)    Medication & Administration     Dosage: Plaque psoriasis: Inject 210mg  under the skin at weeks 0, 1, 2, followed by 210mg  every 2 weeks thereafter      REMS Program:  Before dispensing Siliq, the pharmacy must verify each time that the prescriber is certified and the patient is enrolled in the REMS program.      Lab tests required prior to treatment initiation:  ??? Tuberculosis: Tuberculosis screening resulted in a non-reactive Quantiferon TB Gold assay. Results from lab tests on 08/13/17       Administration:     Prefilled syringe  1. Gather all supplies needed for injection on a clean, flat working surface: medication syringe(s) removed from packaging, alcohol swab, sharps container, etc.  2. Look at the medication label ??? look for correct medication, correct dose, and check the expiration date  3. Look at the medication ??? the liquid in the syringe should appear clear and colorless to slightly yellow  4. Lay the syringe on a flat surface and allow it to warm up to room temperature for at least 30 minutes  5. Select injection site ??? you can use the front of your thigh or your belly (but not the area 2 inches around your belly button); if someone else is giving you the injection you can also use your upper arm in the skin covering your triceps muscle  6. Prepare injection site ??? wash your hands and clean the skin at the injection site with an alcohol swab and let it air dry, do not touch the injection site again before the injection  7. Pull off the needle safety cap, do not remove until immediately prior to injection  8. Pinch the skin ??? with your hand not holding the syringe pinch up a fold of skin at the injection site using your forefinger and thumb  9. Insert the needle into the fold of skin at about a 45 degree angle ??? it's best to use a quick dart-like motion  10. Push the plunger down slowly as far as it will go until the syringe is empty  11. Check that the syringe is empty and pull the needle out at the same angle as inserted  12. Dispose of the used syringe immediately in your sharps disposal container, do not attempt to recap the needle prior to disposing  13. If you see any blood at the injection site, press a cotton ball or gauze on the site and maintain pressure until the bleeding stops, do not rub the injection site      Adherence/Missed dose instructions:  If your injection is given more than 2  days after your scheduled injection date ??? consult your pharmacist for additional instructions on how to adjust your dosing schedule.    Goals of Therapy     ??? Minimize areas of skin involvement (% BSA)  ??? Avoidance of long term glucocorticoid use  ??? Maintenance of effective psychosocial functioning      Side Effects & Monitoring Parameters     ??? Injection site reaction (redness, irritation, inflammation localized to the site of administration)  ??? Signs of a common cold ??? minor sore throat, runny or stuffy nose, etc.  ??? Upset stomach  ??? Headache  ??? Joint pain    The following side effects should be reported to the provider:  ??? Signs of a hypersensitivity reaction ??? rash; hives; itching; red, swollen, blistered, or peeling skin; wheezing; tightness in the chest or throat; difficulty breathing, swallowing, or talking; swelling of the mouth, face, lips, tongue, or throat; etc.  ??? Reduced immune function ??? report signs of infection such as fever; chills; body aches; very bad sore throat; ear or sinus pain; cough; more sputum or change in color of sputum; pain with passing urine; wound that will not heal, etc.  Also at a slightly higher risk of some malignancies (mainly skin and blood cancers) due to this reduced immune function.  o In the case of signs of infection ??? the patient should hold the next dose of Siliq?? and call your primary care provider to ensure adequate medical care.  Treatment may be resumed when infection is treated and patient is asymptomatic.  ??? Signs of Crohn's disease ??? bloody stools, fever, painful diarrhea, stomach pain/cramps, weight loss, etc.  ??? Any changes in mood or behavior ??? up to and including suicidality  ??? Muscle pain or weakness  ??? Shortness of breath      Contraindications, Warnings, & Precautions     ??? Have your bloodwork checked as you have been told by your prescriber  ??? Patients who take this drug are at a greater risk of having thoughts or actions of suicide; this risk may be greater in patients who have had these thoughts or actions in the past  ??? Talk with your doctor if you are pregnant, planning to become pregnant, or breastfeeding  ??? Discuss the possible need for holding your dose(s) of Siliq?? when a planned procedure is scheduled with the prescriber as it may delay healing/recovery timeline       Drug/Food Interactions     ??? Medication list reviewed in Epic. The patient was instructed to inform the care team before taking any new medications or supplements. No drug interactions identified.   ??? Talk with you prescriber or pharmacist before receiving any live vaccinations while taking this medication and after you stop taking it      Storage, Handling Precautions, & Disposal     ??? Store this medication in the refrigerator.  Do not freeze  ??? If needed, you may store at room temperature for up to 14 days  ??? Store in original packaging, protected from light  ??? Do not shake  ??? Dispose of used syringes in a sharps disposal container      Current Medications (including OTC/herbals), Comorbidities and Allergies     Current Outpatient Medications   Medication Sig Dispense Refill   ??? brodalumab 210 mg/1.5 mL Syrg Inject 1 syringe (210 mg) under the skin at week 0, 1, and 2 for loading doses 4.5 mL 0   ??? brodalumab 210 mg/1.5 mL  Syrg Inject the contents of 1 syringe (210 mg) under the skin every fourteen (14) days for maintenance dose 3 mL 11   ??? propranolol (INDERAL) 20 MG tablet TAKE 1 TABLET BY MOUTH EVERY DAY     ??? secukinumab (COSENTYX) 150 mg/mL PnIj injection Inject the contents of 2 pens (300 mg total) under the skin once a week. For the first 5 doses. 10 mL 0     No current facility-administered medications for this visit.       No Known Allergies    Patient Active Problem List   Diagnosis   ??? Hidradenitis suppurativa       Reviewed and up to date in Epic.    Appropriateness of Therapy     Is medication and dose appropriate based on diagnosis? Yes    Prescription has been clinically reviewed: Yes    Baseline Quality of Life Assessment      How many days over the past month did your HS  keep you from your normal activities? For example, brushing your teeth or getting up in the morning. 0    Financial Information     Medication Assistance provided: Prior Authorization    Anticipated copay of $3 / 28 days reviewed with patient. Verified delivery address.    Delivery Information     Scheduled delivery date: 11/13/19 for first 2 weeks and 11/26/19 starting maintanence    Expected start date: 11/14/19    Medication will be delivered via UPS to the prescription address in [facility].  This shipment will not require a signature.      Explained the services we provide at [facility] and that each month we would call to set up refills.  Stressed importance of returning phone calls so that we could ensure they receive their medications in time each month.  Informed patient that we should be setting up refills 7-10 days prior to when they will run out of medication.  A pharmacist will reach out to perform a clinical assessment periodically.  Informed patient that a welcome packet and a drug information handout will be sent.      Patient verbalized understanding of the above information as well as how to contact the pharmacy at [phone] option 4 with any questions/concerns.  The pharmacy is open Monday through Friday 8:30am-4:30pm.  A pharmacist is available 24/7 via pager to answer any clinical questions they may have.    Patient Specific Needs     - Does the patient have any physical, cognitive, or cultural barriers? No    - Patient prefers to have medications discussed with  Patient     - Is the patient or caregiver able to read and understand education materials at a high school level or above? Yes    - Patient's primary language is  English     - Is the patient high risk? No    - Does the patient require a Care Management Plan? No     - Does the patient require physician intervention or other additional services (i.e. nutrition, smoking cessation, social work)? No      Debbie Gregory  [facility] Specialty Pharmacist

## 2019-11-12 DIAGNOSIS — L4050 Arthropathic psoriasis, unspecified: Principal | ICD-10-CM

## 2019-11-12 DIAGNOSIS — L409 Psoriasis, unspecified: Principal | ICD-10-CM

## 2019-11-12 MED ORDER — COSENTYX 300 MG/2 SYRINGES (150 MG/ML) SUBCUTANEOUS
SUBCUTANEOUS | 3 refills | 28.00000 days | Status: CP
Start: 2019-11-12 — End: 2020-02-09
  Filled 2019-11-17: qty 4, 28d supply, fill #0

## 2019-11-13 NOTE — Unmapped (Signed)
[facility] Refill Coordination Note    Specialty Medication(s) to be Shipped:   Inflammatory Disorders: Cosentyx    Other medication(s) to be shipped: No additional medications requested for fill at this time     Debbie Gregory, DOB: Mar 08, 1990  Phone: [phone] (home)       All above HIPAA information was verified with patient.     Was a Nurse, learning disability used for this call? No    Completed refill call assessment today to schedule patient's medication shipment from the [facility] [phone]).       Specialty medication(s) and dose(s) confirmed: Regimen is correct and unchanged.   Changes to medications: Debbie Gregory reports no changes at this time.  Changes to insurance: No  Questions for the pharmacist: No    Confirmed patient received Welcome Packet with first shipment. The patient will receive a drug information handout for each medication shipped and additional FDA Medication Guides as required.       DISEASE/MEDICATION-SPECIFIC INFORMATION        For patients on injectable medications: Patient currently has 0 doses left.  Next injection is scheduled for 11/21/19.    SPECIALTY MEDICATION ADHERENCE     Medication Adherence    Patient reported X missed doses in the last month: 0  Specialty Medication: Cosentyx 150mg /mL  Patient is on additional specialty medications: No  Informant: patient                Cosentyx 150 mg/ml: 0 days of medicine on hand         SHIPPING     Shipping address confirmed in Epic.     Delivery Scheduled: Yes, Expected medication delivery date: 11/18/19.     Medication will be delivered via UPS to the prescription address in [facility] [place].    Debbie Gregory Debbie Gregory   [facility] Specialty Technician

## 2019-11-13 NOTE — Unmapped (Signed)
Debbie Gregory 's Cosentyx shipment will be delayed as a result of a new prescription for the medication has been received.      I have reached out to the patient and left a voicemail message.  We will wait for a call back from the patient to reschedule the delivery.  We have not confirmed the new delivery date.

## 2019-11-17 MED FILL — COSENTYX 300 MG/2 SYRINGES (150 MG/ML) SUBCUTANEOUS: 28 days supply | Qty: 4 | Fill #0 | Status: AC

## 2019-12-17 MED FILL — SILIQ 210 MG/1.5 ML SUBCUTANEOUS SYRINGE: 14 days supply | Qty: 3 | Fill #0 | Status: AC

## 2019-12-17 NOTE — Unmapped (Signed)
See onboarding note from 11/1. I reviewed dosing with Jennye, and we have set up her Siliq set up to ship via UPS to prescription address for 12/9 delivery (starter pack) and 12/22 (maintenance pack). All questions were answered.    Vaughan Garfinkle A. Katrinka Blazing, PharmD, BCPS - Pharmacist   [facility]   [address], [place], [place] [address]   t [phone] - f [phone]

## 2019-12-30 MED FILL — SILIQ 210 MG/1.5 ML SUBCUTANEOUS SYRINGE: 28 days supply | Qty: 3 | Fill #0 | Status: AC

## 2019-12-30 MED FILL — SILIQ 210 MG/1.5 ML SUBCUTANEOUS SYRINGE: SUBCUTANEOUS | 28 days supply | Qty: 3 | Fill #0

## 2020-01-20 NOTE — Unmapped (Signed)
Debbie Gregory reports her Siliq injections have gone well so far - she's had no adverse effects or issues with injection. She hasn't seen any improvement in her HS yet, but understands it's still early in treatment.     [facility] Clinical Assessment & Refill Coordination Note    Debbie Gregory, DOB: 1990-02-03  Phone: [phone] (home)     All above HIPAA information was verified with patient.     Was a Nurse, learning disability used for this call? No    Specialty Medication(s):   Inflammatory Disorders: Siliq     Current Outpatient Medications   Medication Sig Dispense Refill   ??? brodalumab 210 mg/1.5 mL Syrg Inject 1 syringe (210 mg) under the skin at week 0, 1, and 2 for loading doses 4.5 mL 0   ??? brodalumab 210 mg/1.5 mL Syrg Inject the contents of 1 syringe (210 mg) under the skin every fourteen (14) days for maintenance dose   *Finish 3 weeks of weekly dosing first* 3 mL 11   ??? propranolol (INDERAL) 20 MG tablet TAKE 1 TABLET BY MOUTH EVERY DAY     ??? secukinumab (COSENTYX) 150 mg/mL PnIj injection Inject the contents of 2 pens (300 mg total) under the skin once a week. For the first 5 doses. 10 mL 0   ??? secukinumab (COSENTYX, 2 SYRINGES,) 150 mg/mL Syrg Inject the contents of 2 syringes (300 mg) under the skin every fourteen (14) days. 4 mL 3     No current facility-administered medications for this visit.        Changes to medications: Debbie Gregory reports no changes at this time.    No Known Allergies    Changes to allergies: No    SPECIALTY MEDICATION ADHERENCE     Siliq - 0 left  Medication Adherence    Patient reported X missed doses in the last month: 0  Specialty Medication: Siliq          Specialty medication(s) dose(s) confirmed: Regimen is correct and unchanged.     Are there any concerns with adherence? No    Adherence counseling provided? Not needed    CLINICAL MANAGEMENT AND INTERVENTION      Clinical Benefit Assessment:    Do you feel the medicine is effective or helping your condition? No    Clinical Benefit counseling provided? Reasonable expectations discussed: may take 2-3 months for full effect    Adverse Effects Assessment:    Are you experiencing any side effects? No    Are you experiencing difficulty administering your medicine? No    Quality of Life Assessment:    How many days over the past month did your HS  keep you from your normal activities? For example, brushing your teeth or getting up in the morning. Patient declined to answer    Have you discussed this with your provider? Not needed    Therapy Appropriateness:    Is therapy appropriate? Yes, therapy is appropriate and should be continued    DISEASE/MEDICATION-SPECIFIC INFORMATION      For patients on injectable medications: Patient currently has 0 doses left.  Next injection is scheduled for Friday, 1/21.    PATIENT SPECIFIC NEEDS     - Does the patient have any physical, cognitive, or cultural barriers? No    - Is the patient high risk? No    - Does the patient require a Care Management Plan? No     - Does the patient require physician intervention or other  additional services (i.e. nutrition, smoking cessation, social work)? No      SHIPPING     Specialty Medication(s) to be Shipped:   Inflammatory Disorders: Siliq    Other medication(s) to be shipped: No additional medications requested for fill at this time     Changes to insurance: No    Delivery Scheduled: Yes, Expected medication delivery date: Wed, Jan 19.     Medication will be delivered via UPS to the confirmed prescription address in [facility].    The patient will receive a drug information handout for each medication shipped and additional FDA Medication Guides as required.  Verified that patient has previously received a Conservation officer, historic buildings.    All of the patient's questions and concerns have been addressed.    Lanney Gins   [facility] Specialty Pharmacist

## 2020-01-27 MED FILL — SILIQ 210 MG/1.5 ML SUBCUTANEOUS SYRINGE: SUBCUTANEOUS | 28 days supply | Qty: 3 | Fill #1

## 2020-02-06 NOTE — Unmapped (Signed)
Dermatology Note     Assessment and Plan:      Hidradenitis Suppurativa, Hurley 2, flaring on Siliq   - We discussed the typical natural history, pathogenesis, treatment options, and expected course as well as the relapsing and sometimes recalcitrant nature of the disease.  - Patient would like to avoid antibiotics due to GI issues  - Given continual flaring on individual medications, discussed option of treating with multiple therapies at once. Jointly agreed to proceed with the following:   - Continue brodalumab 210 mg/1.5 mL Syrg. Inject 210 mg under the skin every fourteen (14)  days. R/B/A of Siliq reviewed including mood swings   - Start vitamin D supplements 1000-1200 IU once daily   - Start zinc supplements 60-90 mg once daily  - May consider some deroofings of axillae in the future (~4cm), though patient hesistant    The patient was advised to call for an appointment should any new, changing, or symptomatic lesions develop.     RTC: Return in about 3 months (around 05/08/2020) for f/u HS. or sooner as needed   _________________________________________________________________      Chief Complaint     Chief Complaint   Patient presents with   ??? HS     ARM PITS STOMACH UNDER BREAST GROIN AREA ALL FLARED  CURRENTLY USING  BRODALUMAB       HPI     Debbie Gregory is a 30 y.o. female who presents as a returning patient (last seen by Dr. Janyth Contes on 10/20/2019) to [facility] for follow up of HS. At last visit, patient was instructed to stop cosentyx and was prescribed brodalumab 210 mg/1.5 mL Syrg for the HS.    Today, she reports that she has not noticed much improvement since starting brodalumab  About 1 month ago. Notes that her HS is about the same since switching from Cosentyx, but has not worsened. Denies any side effects from the medication. Endorses pruritus today on spots that are breaking out on her stomach, breast, groin, thigh and underarms, which she notes is not typical. Reports that previous treatment with Humira provided relief for about a year and then suddenly reduced in effectiveness, causing her to breakout. Previously on remicade, as well, without relief.     Self-reported severity (0-5): 4  VAS pain today: 3  VAS average pain for the last month: 3  Requiring pain medication? Yes.  If so, what type/frequency? Tylenol, epsom bath, bleach bath, heating pad  How often in pain?  continuously  Level of odor (0-5): 3  Level of itching (0-5): 5  Dressing changes needed for drainage:Twice a day  How much drainage: some drainage  Flare in the last month (Y/N)? No.  How long ago was the last flare? in the last year  Developing new lesions? once a month  Number of inflammatory lesions montly: 5-10  DLQI: 12  Current treatment: Siliq    How helpful is the current treatment in managing the following aspects of your disease?  Not at all helpful Somewhat helpful Very helpful   Pain x ?? ??   Decreasing length of flares x ?? ??   Decreasing new lesions ?? x ??   Drainage x ?? ??   Decreasing frequency of flares x ?? ??   Decreasing severity of flares x ?? ??   Odor x ?? ??     The patient denies any other new or changing lesions or areas of concern.     Pertinent Past  Medical History     FH:      Patient Mother Father Son Daughter Brother Sister Maternal Grandmother Maternal Grandfather Paternal Grandmother Paternal Grandfather Maternal Aunt Maternal Uncle Paternal Aunt Paternal Uncle Other:   Derm Hidradenitis Suppurativa   x                                   Pilonidal sinus     x                                Acne                                      Dissecting cellulitis(Scalp)                                     Eczema         x                             Allergies                                    Rheum Joint pains                                      SAPHO                                     Pyoderma gangrenosum                                     Back pain                                     Auto-immune disease Asthma                                    Endo Polycystic ovarian syndrome                                      Thyroid disease   x                                 Vitamin D deficiency   x                               Psych Anxiety  Depression                                     Dementia                                     Suicidal thoughts*                                   Cardio Hypertension                                     High cholesterol    x                                 Heart attack                                     Stroke                                     Hem-onc Cancer  ______________                                     Anemia  x                                 GI IBD (UC/Crohn's)                                   ID HIV                                      Syphilis                                      Other                                            Social History:  Current or former smoker? current  Amount smoking: less than half ppd  How many years: Not answered  ED visits in the last 5 years? never  Difficulty affording medications? never  Marital Status: single  Living with some one? Yes.     Prior treatments:  Topical: Did not list which ones  Systemic: Doxycycline,Minocycline, Clindamycin, Humira, Spironolactone, Cosentyx, Remicade  Past surgical procedures: None  Past laser procedures: None    Physical Examination     OBJECTIVE:   Gen: Well-appearing patient, appropriate, interactive, in no acute distress  Skin: Examination of the scalp, face, neck, chest,  back, abdomen, bilateral upper and lower extremities, hands, palms, soles, nails, buttocks, and external genitalia performed today and pertinent for:     location Abscess Inflamed nodule Non-inflamed nodule Draining sinus Non-draining Sinus Hurley BSA at site Color change  Inflamed induration Open skin surface  Tunnels   R axilla  3  1          L axilla  1   1         R inframammary  2            L inframammary  2            Intermammary              Pubic              R inguinal              R thigh              L inguinal  1            L thigh              Scrotum/Vulva              Perianal              R buttock              L buttock              abdomen  1                          *0=none, 1=mild, 2=moderate, 3=severe    AN count (total sum of abscess and inflammatory nodule): 10    Scribe's Attestation: Elsie Stain, MD obtained and performed the history, physical exam and medical decision making elements that were entered into the chart.  Signed by Milinda Cave, Scribe, on February 09, 2020 at 1:32 PM    ----------------------------------------------------------------------------------------------------------------------  February 09, 2020 5:24 PM. Documentation assistance provided by the Scribe. I was present during the time the encounter was recorded. The information recorded by the Scribe was done at my direction and has been reviewed and validated by me.  ----------------------------------------------------------------------------------------------------------------------       (Approved Template 09/22/2019)

## 2020-02-06 NOTE — Unmapped (Addendum)
For the HS:  - Inject 1 brodalumab (Siliq) syringe every 14 days  - Take over the counter zinc supplements  60-90 mg once daily  - Take over the counter vitamin d supplements 1000-1200 international IU  once daily

## 2020-02-09 ENCOUNTER — Ambulatory Visit: Admit: 2020-02-09 | Discharge: 2020-02-10 | Payer: PRIVATE HEALTH INSURANCE

## 2020-02-09 DIAGNOSIS — Z79899 Other long term (current) drug therapy: Principal | ICD-10-CM

## 2020-02-09 DIAGNOSIS — L910 Hypertrophic scar: Principal | ICD-10-CM

## 2020-02-09 DIAGNOSIS — L732 Hidradenitis suppurativa: Principal | ICD-10-CM

## 2020-02-09 NOTE — Unmapped (Deleted)
Self-reported severity (0-5): 4  VAS pain today: 3  VAS average pain for the last month: 3  Requiring pain medication? Yes.  If so, what type/frequency? Tylenol, epsom bath, bleach bath, heating pad  How often in pain?  continuously  Level of odor (0-5): 3  Level of itching (0-5): 5  Dressing changes needed for drainage:Twice a day  How much drainage: some drainage  Flare in the last month (Y/N)? No.  How long ago was the last flare? in the last year  Developing new lesions? once a month  Number of inflammatory lesions montly: 5-10  DLQI: 12  Current treatment: ***     How helpful is the current treatment in managing the following aspects of your disease?  Not at all helpful Somewhat helpful Very helpful   Pain x     Decreasing length of flares x     Decreasing new lesions  x    Drainage x     Decreasing frequency of flares x     Decreasing severity of flares x     Odor x

## 2020-02-20 NOTE — Unmapped (Signed)
[facility] Refill Coordination Note    Specialty Medication(s) to be Shipped:   Inflammatory Disorders: Siliq    Other medication(s) to be shipped: No additional medications requested for fill at this time     CAMELLIA POPESCU, DOB: Aug 02, 1990  Phone: [phone] (home)       All above HIPAA information was verified with patient.     Was a Nurse, learning disability used for this call? No    Completed refill call assessment today to schedule patient's medication shipment from the [facility] [phone]).       Specialty medication(s) and dose(s) confirmed: Regimen is correct and unchanged.   Changes to medications: Myana reports no changes at this time.  Changes to insurance: No  Questions for the pharmacist: No    Confirmed patient received Welcome Packet with first shipment. The patient will receive a drug information handout for each medication shipped and additional FDA Medication Guides as required.       DISEASE/MEDICATION-SPECIFIC INFORMATION        For patients on injectable medications: Patient currently has 0 doses left.  Next injection is scheduled for 2/25.    SPECIALTY MEDICATION ADHERENCE     Medication Adherence    Patient reported X missed doses in the last month: 0  Specialty Medication: Siliq 210 mg/1.5 ml  Patient is on additional specialty medications: No  Patient is on more than two specialty medications: No  Any gaps in refill history greater than 2 weeks in the last 3 months: no  Demonstrates understanding of importance of adherence: yes  Informant: patient                Siliq 210mg /1.81ml: Patient has 0 days of medication on hand      SHIPPING     Shipping address confirmed in Epic.     Delivery Scheduled: Yes, Expected medication delivery date: 2/22.     Medication will be delivered via UPS to the prescription address in Epic WAM.    Olga Millers   [facility] Specialty Technician

## 2020-03-01 MED FILL — SILIQ 210 MG/1.5 ML SUBCUTANEOUS SYRINGE: SUBCUTANEOUS | 28 days supply | Qty: 3 | Fill #2

## 2020-03-25 NOTE — Unmapped (Signed)
[facility] Refill Coordination Note    Specialty Medication(s) to be Shipped:   Inflammatory Disorders: Siliq    Other medication(s) to be shipped: No additional medications requested for fill at this time     Debbie Gregory, DOB: 06-May-1990  Phone: [phone] (home)       All above HIPAA information was verified with patient.     Was a Nurse, learning disability used for this call? No    Completed refill call assessment today to schedule patient's medication shipment from the [facility] [phone]).       Specialty medication(s) and dose(s) confirmed: Regimen is correct and unchanged.   Changes to medications: Nusayba reports no changes at this time.  Changes to insurance: No  Questions for the pharmacist: No    Confirmed patient received Welcome Packet with first shipment. The patient will receive a drug information handout for each medication shipped and additional FDA Medication Guides as required.       DISEASE/MEDICATION-SPECIFIC INFORMATION        For patients on injectable medications: Patient currently has 0 doses left.  Next injection is scheduled for 04/02/2020.    SPECIALTY MEDICATION ADHERENCE     Medication Adherence    Patient reported X missed doses in the last month: 0  Specialty Medication: Siliq 210 mg/1.5 ml  Patient is on additional specialty medications: No  Any gaps in refill history greater than 2 weeks in the last 3 months: no  Demonstrates understanding of importance of adherence: yes  Informant: patient  Reliability of informant: reliable  Confirmed plan for next specialty medication refill: delivery by pharmacy  Refills needed for supportive medications: not needed                Siliq 210mg /1.48ml: Patient has 0 days of medication on hand      SHIPPING     Shipping address confirmed in Epic.     Delivery Scheduled: Yes, Expected medication delivery date: 03/30/2020.     Medication will be delivered via UPS to the prescription address in Epic WAM.    Pooja Camuso D Baili Stang   [facility] Specialty Technician

## 2020-03-29 MED FILL — SILIQ 210 MG/1.5 ML SUBCUTANEOUS SYRINGE: SUBCUTANEOUS | 28 days supply | Qty: 3 | Fill #3

## 2020-04-20 NOTE — Unmapped (Signed)
[facility] Refill Coordination Note    Specialty Medication(s) to be Shipped:   Inflammatory Disorders: Siliq    Other medication(s) to be shipped: No additional medications requested for fill at this time     Debbie Gregory, DOB: 29-Mar-1990  Phone: [phone] (home)       All above HIPAA information was verified with patient.     Was a Nurse, learning disability used for this call? No    Completed refill call assessment today to schedule patient's medication shipment from the [facility] [phone]).  All relevant notes have been reviewed.     Specialty medication(s) and dose(s) confirmed: Regimen is correct and unchanged.   Changes to medications: [name] reports no changes at this time.  Changes to insurance: No  New side effects reported not previously addressed with a pharmacist or physician: None reported  Questions for the pharmacist: No    Confirmed patient received a Conservation officer, historic buildings and a Surveyor, mining with first shipment. The patient will receive a drug information handout for each medication shipped and additional FDA Medication Guides as required.       DISEASE/MEDICATION-SPECIFIC INFORMATION        For patients on injectable medications: Patient currently has 0 doses left.  Next injection is scheduled for 04/30/20.    SPECIALTY MEDICATION ADHERENCE     Medication Adherence    Patient reported X missed doses in the last month: 0  Specialty Medication: Siliq 210 mg/1.5 ml  Patient is on additional specialty medications: No              Were doses missed due to medication being on hold? No      REFERRAL TO PHARMACIST     Referral to the pharmacist: Not needed      [facility]     Shipping address confirmed in Epic.     Delivery Scheduled: Yes, Expected medication delivery date: 04/23/20.     Medication will be delivered via UPS to the prescription address in Epic WAM.    [name]   [facility] Specialty Technician

## 2020-04-22 MED FILL — SILIQ 210 MG/1.5 ML SUBCUTANEOUS SYRINGE: SUBCUTANEOUS | 28 days supply | Qty: 3 | Fill #4

## 2020-05-16 NOTE — Unmapped (Signed)
Dermatology Note     Assessment and Plan:      Hidradenitis Suppurativa, Hurley 2, flaring on Siliq   - We discussed the typical natural history, pathogenesis, treatment options, and expected course as well as the relapsing and sometimes recalcitrant nature of the disease.  - Patient would like to avoid antibiotics due to GI issues  - Given continual flaring on individual medications, discussed option of treating with multiple therapies at once. Jointly agreed to proceed with the following:   - Start upadacitinib (RINVOQ) 15 mg Tb24; Take 15 mg by mouth daily.   - Continue brodalumab 210 mg/1.5 mL Syrg. Inject 210 mg under the skin every fourteen (14) days. R/B/A of Siliq reviewed including mood swings   - Continue vitamin D supplements 1000-1200 IU once daily   - Continue zinc supplements 60-90 mg once daily  - May consider some deroofings of axillae in the future (~4cm), though patient hesistant  - Labs ordered today:   - Quantiferon TB Gold Plus; Future   - Hepatitis B Core Antibody, total; Future   - Hepatitis B Surface Antigen; Future   - Hepatitis C Antibody; Future   - Hepatitis B Surface Antibody; Future   - CBC w/ Differential   - Comprehensive metabolic panel; Future      The patient was advised to call for an appointment should any new, changing, or symptomatic lesions develop.     RTC: Return in about 3 months (around 08/17/2020). or sooner as needed   _________________________________________________________________      Chief Complaint     Follow-up of HS    HPI     Debbie Gregory is a 30 y.o. female who presents as a returning patient (last seen by Dr. Janyth Contes on 02/09/2020) to [facility] for follow-up of hidradenitis suppurativa. At last visit, patient was to continue brodalumab 210 mg/1.5 mL, start vitamin D supplements 1000-1200 IU once daily, as well as to start zinc supplements 60-90 mg once daily.  She has been on brodalumab 210mg  every 2 weeks and has not seen any improvement in the last 4-5 months.    Self-reported severity (0-5): 2  VAS pain today: 3  VAS average pain for the last month: 4  Requiring pain medication? Yes.  If so, what type/frequency? Tylenol, Epsom salt baths, Heating pads  How often in pain?  continuously  Level of odor (0-5): 3  Level of itching (0-5): 5  Dressing changes needed for drainage:Twice a day  How much drainage: some drainage  Flare in the last month (Y/N)? No.  How long ago was the last flare? in the last year  Developing new lesions? once a month  Number of inflammatory lesions montly: 3-5  DLQI: 14  Current treatment:   How helpful is the current treatment in managing the following aspects of your disease?  Not at all helpful Somewhat helpful Very helpful   Pain  X    Decreasing length of flares  X    Decreasing new lesions  X    Drainage X     Decreasing frequency of flares      Decreasing severity of flares  X    Odor X          The patient denies any other new or changing lesions or areas of concern.     Pertinent Past Medical History     No history of skin cancer    Social History:  Current or former smoker? current  Amount  smoking: less than half ppd  How many years: Not answered  ED visits in the last 5 years? never  Difficulty affording medications? never  Marital Status: single  Living with some one? Yes.     Prior treatments:  Topical: Did not list which ones  Systemic: Doxycycline,Minocycline, Clindamycin, Humira, Spironolactone, Cosentyx, Remicade  Past surgical procedures: None  Past laser procedures: None    Family History:   Negative for melanoma    Past Medical History, Family History, Social History, Medication List, Allergies, and Problem List were reviewed in the rooming section of Epic.     ROS: Other than symptoms mentioned in the HPI, no fevers, chills, or other skin complaints    Physical Examination     Gen: Well-appearing patient, appropriate, interactive, in no acute distress  Skin: Examination of the scalp, face, neck, chest, back, abdomen, bilateral upper and lower extremities, hands, palms, soles, nails, buttocks, and external genitalia performed today and pertinent for:     location Abscess Inflamed nodule Non-inflamed nodule Draining sinus Non-draining Sinus Hurley BSA at site Color change  Inflamed induration Open skin surface  Tunnels   R axilla  2  1          L axilla  1  1          R inframammary              L inframammary              Intermammary              Pubic              R inguinal  2            R thigh              L inguinal  2            L thigh              Scrotum/Vulva              Perianal              R buttock              L buttock              Other (list)                            *0=none, 1=mild, 2=moderate, 3=severe    AN count (total sum of abscess and inflammatory nodule): 7    -sites not commented on demonstrate normal findings.       Scribe's Attestation: Tammi Sou, MD obtained and performed the history, physical exam and medical decision making elements that were  entered into the chart. Documentation assistance was provided by me personally, a scribe. Signed by Joylene Draft, Scribe, on May 17, 2020 9:18 AM    ----------------------------------------------------------------------------------------------------------------------  May 17, 2020 1:24 PM. Documentation assistance provided by the Scribe. I was present during the time the encounter was recorded. The information recorded by the Scribe was done at my direction and has been reviewed and validated by me.  ----------------------------------------------------------------------------------------------------------------------        (Approved Template 09/22/2019)

## 2020-05-17 ENCOUNTER — Ambulatory Visit: Admit: 2020-05-17 | Discharge: 2020-05-18 | Payer: PRIVATE HEALTH INSURANCE

## 2020-05-17 DIAGNOSIS — L732 Hidradenitis suppurativa: Principal | ICD-10-CM

## 2020-05-17 MED ORDER — RINVOQ 15 MG TABLET,EXTENDED RELEASE
ORAL_TABLET | Freq: Every day | ORAL | 11 refills | 0.00000 days | Status: CP
Start: 2020-05-17 — End: ?

## 2020-05-17 NOTE — Unmapped (Addendum)
Thank you for visiting [place] in clinic today, the plan we discussed was as follows:     1) Hidradenitis suppurativa    - We ordered a TB blood test and other blood tests today, please have this completed today at the [facility] campus   - Start upadacitinib [facility]) 15 mg Tb24; Take 15 mg by mouth daily.   - Continue brodalumab 210 mg/1.5 mL Syrg. Inject 210 mg under the skin every fourteen (14) days.

## 2020-05-26 NOTE — Unmapped (Unsigned)
The [facility] has made a second and final attempt to reach this patient to refill the following medication:Siliq.      We have left voicemails on the following phone numbers: [phone] and have sent a MyChart message.    Dates contacted: 5/13 and 5/18  Last scheduled delivery: 4/15    The patient may be at risk of non-compliance with this medication. The patient should call the [facility] at [phone] (option 4) to refill medication.    Octaviano Mukai D Administrator Shared [facility] Specialty Technician

## 2020-06-30 NOTE — Unmapped (Signed)
[facility]SC Specialty Medication Onboarding    Specialty Medication: Rinvoq 15mg  tabs  Prior Authorization: N/A  Financial Assistance: No - MAPs has reached maximum number of attempts to obtain necessary information from the patient in regards to the financial assistance process  Final Copay/Day Supply: $cash price in wam / 30 days    Insurance Restrictions: n/a    Notes to Pharmacist:     The triage team has completed the benefits investigation and has determined that the patient is able to fill this medication at [facility]. Please contact the patient to complete the onboarding or follow up with the prescribing physician as needed.

## 2020-09-20 NOTE — Unmapped (Signed)
Specialty Medication(s): Siliq    [name] has been dis-enrolled from the [facility] specialty pharmacy services due to multiple unsuccessful outreach attempts by the pharmacy.    Additional information provided to the patient:  MAPS unable to reach to start Rinvoq mfg assistance, pharmacy unable to reach for Siliq refills. Patient's appt 09/20/20 was cancelled. If new orders received, will re-enroll.     Debbie Gregory  [facility] Specialty Pharmacist

## 2021-05-02 ENCOUNTER — Ambulatory Visit: Admit: 2021-05-02 | Discharge: 2021-05-02 | Payer: PRIVATE HEALTH INSURANCE

## 2021-05-02 DIAGNOSIS — Z79899 Other long term (current) drug therapy: Principal | ICD-10-CM

## 2021-05-02 DIAGNOSIS — L910 Hypertrophic scar: Principal | ICD-10-CM

## 2021-05-02 DIAGNOSIS — L732 Hidradenitis suppurativa: Principal | ICD-10-CM

## 2021-05-02 MED ORDER — UPADACITINIB ER 30 MG TABLET,EXTENDED RELEASE 24 HR
ORAL_TABLET | Freq: Every day | ORAL | 11 refills | 30.00000 days | Status: CP
Start: 2021-05-02 — End: ?

## 2021-07-04 MED ORDER — SHINGRIX (PF) 50 MCG/0.5 ML INTRAMUSCULAR SUSPENSION, KIT
1 refills | 0.00000 days | Status: CP
Start: 2021-07-04 — End: ?

## 2021-08-29 ENCOUNTER — Ambulatory Visit: Admit: 2021-08-29 | Discharge: 2021-08-30 | Payer: PRIVATE HEALTH INSURANCE

## 2021-08-29 DIAGNOSIS — L732 Hidradenitis suppurativa: Principal | ICD-10-CM

## 2021-08-29 DIAGNOSIS — Z79899 Other long term (current) drug therapy: Principal | ICD-10-CM

## 2021-08-29 MED ORDER — HUMIRA(CF) PEDIATRIC CROHN'S STARTER 80 MG/0.8 ML SUBCUT SYRINGE KIT
ORAL | 11 refills | 0.00000 days | Status: CP
Start: 2021-08-29 — End: ?

## 2021-09-01 DIAGNOSIS — Z79899 Other long term (current) drug therapy: Principal | ICD-10-CM

## 2021-09-01 DIAGNOSIS — L732 Hidradenitis suppurativa: Principal | ICD-10-CM

## 2021-09-01 MED ORDER — HUMIRA(CF) PEN 80 MG/0.8 ML SUBCUTANEOUS KIT
SUBCUTANEOUS | 11 refills | 28 days | Status: CP
Start: 2021-09-01 — End: ?

## 2021-09-02 DIAGNOSIS — L732 Hidradenitis suppurativa: Principal | ICD-10-CM

## 2021-09-02 MED ORDER — ADALIMUMAB 80 MG/0.8 ML SUBCUTANEOUS SYRINGE KIT
SUBCUTANEOUS | 11 refills | 28 days | Status: CP
Start: 2021-09-02 — End: ?
  Filled 2021-09-15: qty 8, 28d supply, fill #0

## 2021-09-06 DIAGNOSIS — L732 Hidradenitis suppurativa: Principal | ICD-10-CM

## 2021-09-13 NOTE — Unmapped (Signed)
[facility] Specialty Medication Onboarding    Specialty Medication: HUMIRA 40 mg/0.8 mL injection (adalimumab)  Prior Authorization: Approved   Financial Assistance: No - copay  <$25  Final Copay/Day Supply: $4 / 28    Insurance Restrictions: None     Notes to Pharmacist: n/a    The triage team has completed the benefits investigation and has determined that the patient is able to fill this medication at [facility]. Please contact the patient to complete the onboarding or follow up with the prescribing physician as needed.

## 2021-09-14 MED ORDER — EMPTY CONTAINER
2 refills | 0 days
Start: 2021-09-14 — End: ?

## 2021-09-14 NOTE — Unmapped (Signed)
[facility]   Patient Onboarding/Medication Counseling    Debbie Gregory is a 31 y.o. female with hidradenitis suppurativa who I am counseling today on initiation of therapy.  I am speaking to the patient.    Was a Nurse, learning disability used for this call? No    Verified patient's date of birth / HIPAA.    Specialty medication(s) to be sent: Inflammatory Disorders: Humira      Non-specialty medications/supplies to be sent: sharps kit      Medications not needed at this time: na       The patient declined counseling on medication administration, missed dose instructions, goals of therapy, side effects and monitoring parameters, warnings and precautions, drug/food interactions, and storage, handling precautions, and disposal because they have taken the medication previously. The information in the declined sections below are for informational purposes only and was not discussed with patient.       Humira (adalimumab)    Medication & Administration     Dosage:  off-label HS maintenance dosing : 80 mg under the skin once weekly    Lab tests required prior to treatment initiation:  Tuberculosis: Tuberculosis screening resulted in a non-reactive Quantiferon TB Gold assay.  Hepatitis B: Hepatitis B serology studies are complete and non-reactive.    Administration:     Prefilled syringe  1. Gather all supplies needed for injection on a clean, flat working surface: medication syringe(s) removed from packaging, alcohol swab, sharps container, etc.  2. Look at the medication label - look for correct medication, correct dose, and check the expiration date  3. Look at the medication - the liquid in the syringe should appear clear and colorless  4. Lay the syringe on a flat surface and allow it to warm up to room temperature for at least 30-45 minutes  5. Select injection site - you can use the front of your thigh or your belly (but not the area 2 inches around your belly button)  6. Prepare injection site - wash your hands and clean the skin at the injection site with an alcohol swab and let it air dry, do not touch the injection site again before the injection  7. Pull off the needle safety cap, do not remove until immediately prior to injection; turn the syringe so the needle is facing up and hold the syringe at eye level with one hand so you can see the air in the syringe; using your other hand, slowly push the plunger in to push the air out through the needle  8. Pinch the skin - with your hand not holding the syringe pinch up a fold of skin at the injection site using your forefinger and thumb  9. Insert the needle into the fold of skin at about a 45 degree angle - it's best to use a quick dart-like motion  10.Push the plunger down slowly as far as it will go until the syringe is empty, hold the syringe in place for a full 5 seconds  11. Check that the syringe is empty and pull the needle out at the same angle as inserted  12. Dispose of the used syringe immediately in your sharps disposal container, do not attempt to recap the needle prior to disposing  13. If you see any blood at the injection site, press a cotton ball or gauze on the site and maintain pressure until the bleeding stops, do not rub the injection site    Adherence/Missed dose instructions:  If your  injection is given more than 3 days after your scheduled injection date - consult your pharmacist for additional instructions on how to adjust your dosing schedule.    Goals of Therapy     - Reduce the frequency and severity of new lesions  - Minimize pain and suppuration  - Prevent disease progression and limit scarring  - Maintenance of effective psychosocial functioning    Side Effects & Monitoring Parameters     Injection site reaction (redness, irritation, inflammation localized to the site of administration)  Signs of a common cold - minor sore throat, runny or stuffy nose, etc.  Upset stomach  Headache    The following side effects should be reported to the provider:  Signs of a hypersensitivity reaction - rash; hives; itching; red, swollen, blistered, or peeling skin; wheezing; tightness in the chest or throat; difficulty breathing, swallowing, or talking; swelling of the mouth, face, lips, tongue, or throat; etc.  Reduced immune function - report signs of infection such as fever; chills; body aches; very bad sore throat; ear or sinus pain; cough; more sputum or change in color of sputum; pain with passing urine; wound that will not heal, etc.  Also at a slightly higher risk of some malignancies (mainly skin and blood cancers) due to this reduced immune function.  In the case of signs of infection - the patient should hold the next dose of Humira?? and call your primary care provider to ensure adequate medical care.  Treatment may be resumed when infection is treated and patient is asymptomatic.  Changes in skin - a new growth or lump that forms; changes in shape, size, or color of a previous mole or marking  Signs of unexplained bruising or bleeding - throwing up blood or emesis that looks like coffee grounds; black, tarry, or bloody stool; etc.  Signs of new or worsening heart failure - shortness of breath; sudden weight gain; heartbeat that is not normal; swelling in the arms or legs that is new or worse      Contraindications, Warnings, & Precautions     Have your bloodwork checked as you have been told by your prescriber  Talk with your doctor if you are pregnant, planning to become pregnant, or breastfeeding  Discuss the possible need for holding your dose(s) of Humira?? when a planned procedure is scheduled with the prescriber as it may delay healing/recovery timeline       Drug/Food Interactions     Medication list reviewed in Epic. The patient was instructed to inform the care team before taking any new medications or supplements. No drug interactions identified.   Talk with you prescriber or pharmacist before receiving any live vaccinations while taking this medication and after you stop taking it    Storage, Handling Precautions, & Disposal     Store this medication in the refrigerator.  Do not freeze  If needed, you may store at room temperature for up to 14 days  Store in original packaging, protected from light  Do not shake  Dispose of used syringes/pens in a sharps disposal container          Current Medications (including OTC/herbals), Comorbidities and Allergies     Current Outpatient Medications   Medication Sig Dispense Refill    adalimumab (HUMIRA) 40 mg/0.8 mL injection Inject the contents of 2 syringes (80 mg total) under the skin every seven (7) days. 8 each 11    brodalumab 210 mg/1.5 mL Syrg Inject 1 syringe (210 mg) under  the skin at week 0, 1, and 2 for loading doses (Patient not taking: Reported on 04/29/2021) 4.5 mL 0    brodalumab 210 mg/1.5 mL Syrg Inject the contents of 1 syringe (210 mg) under the skin every fourteen (14) days for maintenance dose   *Finish 3 weeks of weekly dosing first* (Patient not taking: Reported on 04/29/2021) 3 mL 11    empty container Misc Use as directed to dispose of Humira syringes. 1 each 2    propranolol (INDERAL) 20 MG tablet TAKE 1 TABLET BY MOUTH EVERY DAY      varicella-zoster [id number], PF, (SHINGRIX, PF,) 50 mcg/0.5 mL SusR injection Administer once and repeat in 2-6 months. 0.5 mL 1     No current facility-administered medications for this visit.       Allergies   Allergen Reactions    Dicyclomine Hcl Nausea Only       Patient Active Problem List   Diagnosis    Hidradenitis suppurativa       Reviewed and up to date in Epic.    Appropriateness of Therapy     Acute infections noted within Epic:  No active infections  Patient reported infection: None    Is medication and dose appropriate based on diagnosis and infection status? Yes    Prescription has been clinically reviewed: Yes      Baseline Quality of Life Assessment      How many days over the past month did your HS  keep you from your normal activities? For example, brushing your teeth or getting up in the morning. Patient declined to answer    Financial Information     Medication Assistance provided: Prior Authorization    Anticipated copay of $4 reviewed with patient. Verified delivery address.    Delivery Information     Scheduled delivery date: Friday, 9/8    Expected start date: 9/8    Medication will be delivered via UPS to the prescription address in [facility].  This shipment will not require a signature.      Explained the services we provide at [facility] and that each month we would call to set up refills.  Stressed importance of returning phone calls so that we could ensure they receive their medications in time each month.  Informed patient that we should be setting up refills 7-10 days prior to when they will run out of medication.  A pharmacist will reach out to perform a clinical assessment periodically.  Informed patient that a welcome packet, containing information about our pharmacy and other support services, a Notice of Privacy Practices, and a drug information handout will be sent.      The patient or caregiver noted above participated in the development of this care plan and knows that they can request review of or adjustments to the care plan at any time.      Patient or caregiver verbalized understanding of the above information as well as how to contact the pharmacy at [phone] option 4 with any questions/concerns.  The pharmacy is open Monday through Friday 8:30am-4:30pm.  A pharmacist is available 24/7 via pager to answer any clinical questions they may have.    Patient Specific Needs     Does the patient have any physical, cognitive, or cultural barriers? No    Does the patient have adequate living arrangements? (i.e. the ability to store and take their medication appropriately) Yes    Did you identify any home environmental safety or  security hazards? No    Patient prefers to have medications discussed with Patient     Is the patient or caregiver able to read and understand education materials at a high school level or above? Yes    Patient's primary language is  English     Is the patient high risk? No    SOCIAL DETERMINANTS OF HEALTH     At the [facility], we have learned that life circumstances - like trouble affording food, housing, utilities, or transportation can affect the health of many of our patients.   That is why we wanted to ask: are you currently experiencing any life circumstances that are negatively impacting your health and/or quality of life? Patient declined to answer    Social Determinants of Health     Financial Resource Strain: Not on file   Internet Connectivity: Not on file   Food Insecurity: Not on file   Tobacco Use: High Risk (05/14/2020)    Patient History     Smoking Tobacco Use: Every Day     Smokeless Tobacco Use: Never     Passive Exposure: Not on file   Housing/Utilities: Not on file   Alcohol Use: Not on file   Transportation Needs: Not on file   Substance Use: Not on file   Health Literacy: Not on file   Physical Activity: Not on file   Interpersonal Safety: Not on file   Stress: Not on file   Intimate Partner Violence: Not on file   Depression: Not on file   Social Connections: Not on file       Would you be willing to receive help with any of the needs that you have identified today? Not applicable       Vann Okerlund A Desiree Lucy Shared [facility] Specialty Pharmacist

## 2021-09-15 MED FILL — EMPTY CONTAINER: 120 days supply | Qty: 1 | Fill #0

## 2021-10-04 NOTE — Unmapped (Signed)
Debbie Gregory reports she's had some new flaring since stopping Rinvoq/starting Humira. She understands it will likely take more time for the medication to build up. She'd like to switch order to citrate free option due to pain, I will message providers.    [facility] Clinical Assessment & Refill Coordination Note    Debbie Gregory, DOB: 1990/05/10  Phone: [phone] (home)     All above HIPAA information was verified with patient.     Was a Nurse, learning disability used for this call? No    Specialty Medication(s):   Inflammatory Disorders: Humira     Current Outpatient Medications   Medication Sig Dispense Refill    adalimumab (HUMIRA) 40 mg/0.8 mL injection Inject the contents of 2 syringes (80 mg total) under the skin every seven (7) days. 8 each 11    brodalumab 210 mg/1.5 mL Syrg Inject 1 syringe (210 mg) under the skin at week 0, 1, and 2 for loading doses (Patient not taking: Reported on 04/29/2021) 4.5 mL 0    brodalumab 210 mg/1.5 mL Syrg Inject the contents of 1 syringe (210 mg) under the skin every fourteen (14) days for maintenance dose   *Finish 3 weeks of weekly dosing first* (Patient not taking: Reported on 04/29/2021) 3 mL 11    empty container Misc Use as directed to dispose of Humira syringes. 1 each 2    propranolol (INDERAL) 20 MG tablet TAKE 1 TABLET BY MOUTH EVERY DAY      varicella-zoster [id number], PF, (SHINGRIX, PF,) 50 mcg/0.5 mL SusR injection Administer once and repeat in 2-6 months. 0.5 mL 1     No current facility-administered medications for this visit.        Changes to medications: Anushree reports no changes at this time.    Allergies   Allergen Reactions    Dicyclomine Hcl Nausea Only       Changes to allergies: No    SPECIALTY MEDICATION ADHERENCE     Humira - 1 dose left  Medication Adherence    Patient reported X missed doses in the last month: 0  Specialty Medication: Humira                            Specialty medication(s) dose(s) confirmed: Regimen is correct and unchanged.     Are there any concerns with adherence? No    Adherence counseling provided? Not needed    CLINICAL MANAGEMENT AND INTERVENTION      Clinical Benefit Assessment:    Do you feel the medicine is effective or helping your condition? No    Clinical Benefit counseling provided? Reasonable expectations discussed: May take 8-12 weeks for effect    Adverse Effects Assessment:    Are you experiencing any side effects? No    Are you experiencing difficulty administering your medicine?  No, but does report injection pain. She'd like to switch order to citrate free option, will message providers    Quality of Life Assessment:    Quality of Life    Rheumatology  Oncology  Dermatology  1. What impact has your specialty medication had on the symptoms of your skin condition (i.e. itchiness, soreness, stinging)?: None  2. What impact has your specialty medication had on your comfort level with your skin?: None  Cystic Fibrosis          How many days over the past month did your HS  keep you from your normal activities?  For example, brushing your teeth or getting up in the morning. Patient declined to answer    Have you discussed this with your provider? Not needed    Acute Infection Status:    Acute infections noted within Epic:  No active infections  Patient reported infection: None    Therapy Appropriateness:    Is therapy appropriate and patient progressing towards therapeutic goals? Yes, therapy is appropriate and should be continued    DISEASE/MEDICATION-SPECIFIC INFORMATION      For patients on injectable medications: Patient currently has 1 doses left.  Next injection is scheduled for 9/29.    PATIENT SPECIFIC NEEDS     Does the patient have any physical, cognitive, or cultural barriers? No    Is the patient high risk? No    Does the patient require a Care Management Plan? No     SOCIAL DETERMINANTS OF HEALTH     At the [facility], we have learned that life circumstances - like trouble affording food, housing, utilities, or transportation can affect the health of many of our patients.   That is why we wanted to ask: are you currently experiencing any life circumstances that are negatively impacting your health and/or quality of life? Patient declined to answer    Social Determinants of Health     Financial Resource Strain: Not on file   Internet Connectivity: Not on file   Food Insecurity: Not on file   Tobacco Use: High Risk (05/14/2020)    Patient History     Smoking Tobacco Use: Every Day     Smokeless Tobacco Use: Never     Passive Exposure: Not on file   Housing/Utilities: Not on file   Alcohol Use: Not on file   Transportation Needs: Not on file   Substance Use: Not on file   Health Literacy: Not on file   Physical Activity: Not on file   Interpersonal Safety: Not on file   Stress: Not on file   Intimate Partner Violence: Not on file   Depression: Not on file   Social Connections: Not on file       Would you be willing to receive help with any of the needs that you have identified today? Not applicable       SHIPPING     Specialty Medication(s) to be Shipped:   Inflammatory Disorders: Humira    Other medication(s) to be shipped: No additional medications requested for fill at this time     Changes to insurance: No    Delivery Scheduled: Yes, Expected medication delivery date: 10/4.     Medication will be delivered via UPS to the confirmed prescription address in [facility].    The patient will receive a drug information handout for each medication shipped and additional FDA Medication Guides as required.  Verified that patient has previously received a Conservation officer, historic buildings and a Surveyor, mining.    The patient or caregiver noted above participated in the development of this care plan and knows that they can request review of or adjustments to the care plan at any time.      All of the patient's questions and concerns have been addressed.    Debbie Gregory   [facility] Specialty Pharmacist

## 2021-10-05 DIAGNOSIS — L732 Hidradenitis suppurativa: Principal | ICD-10-CM

## 2021-10-05 DIAGNOSIS — Z79899 Other long term (current) drug therapy: Principal | ICD-10-CM

## 2021-10-05 MED ORDER — HUMIRA SYRINGE CITRATE FREE 40 MG/0.4 ML
SUBCUTANEOUS | 11 refills | 14.00000 days | Status: CP
Start: 2021-10-05 — End: ?
  Filled 2021-10-11: qty 8, 28d supply, fill #0

## 2021-11-03 NOTE — Unmapped (Signed)
[facility] Refill Coordination Note    Specialty Medication(s) to be Shipped:   Inflammatory Disorders: Humira    Other medication(s) to be shipped: No additional medications requested for fill at this time     Debbie Gregory, DOB: Jul 21, 1990  Phone: [phone] (home)       All above HIPAA information was verified with patient.     Was a Nurse, learning disability used for this call? No    Completed refill call assessment today to schedule patient's medication shipment from the [facility] [phone]).  All relevant notes have been reviewed.     Specialty medication(s) and dose(s) confirmed: Regimen is correct and unchanged.   Changes to medications: Tanelle reports no changes at this time.  Changes to insurance: No  New side effects reported not previously addressed with a pharmacist or physician: None reported  Questions for the pharmacist: No    Confirmed patient received a Conservation officer, historic buildings and a Surveyor, mining with first shipment. The patient will receive a drug information handout for each medication shipped and additional FDA Medication Guides as required.       DISEASE/MEDICATION-SPECIFIC INFORMATION        For patients on injectable medications: Patient currently has 1 doses left.  Next injection is scheduled for 10/27.    SPECIALTY MEDICATION ADHERENCE     Medication Adherence    Patient reported X missed doses in the last month: 0  Specialty Medication: Humira  Patient is on additional specialty medications: No  Any gaps in refill history greater than 2 weeks in the last 3 months: no  Demonstrates understanding of importance of adherence: yes  Informant: patient  Reliability of informant: reliable              Confirmed plan for next specialty medication refill: delivery by pharmacy  Refills needed for supportive medications: not needed              Were doses missed due to medication being on hold? No    Humira CF 40/0.4 mg/ml: 7 days of medicine on hand       REFERRAL TO PHARMACIST     Referral to the pharmacist: Not needed      [facility]     Shipping address confirmed in Epic.     Delivery Scheduled: Yes, Expected medication delivery date: 10/31.     Medication will be delivered via UPS to the prescription address in Epic WAM.    Valere Dross   [facility] Specialty Technician

## 2021-11-07 MED FILL — HUMIRA SYRINGE CITRATE FREE 40 MG/0.4 ML: SUBCUTANEOUS | 28 days supply | Qty: 8 | Fill #1

## 2021-12-05 NOTE — Unmapped (Signed)
[facility] Refill Coordination Note    Specialty Medication(s) to be Shipped:   Inflammatory Disorders: Humira    Other medication(s) to be shipped: No additional medications requested for fill at this time     Debbie Gregory, DOB: 06/11/1990  Phone: [phone] (home)       All above HIPAA information was verified with patient.     Was a Nurse, learning disability used for this call? No    Completed refill call assessment today to schedule patient's medication shipment from the [facility] [phone]).  All relevant notes have been reviewed.     Specialty medication(s) and dose(s) confirmed: Regimen is correct and unchanged.   Changes to medications: [name] reports no changes at this time.  Changes to insurance: No  New side effects reported not previously addressed with a pharmacist or physician: None reported  Questions for the pharmacist: No    Confirmed patient received a Conservation officer, historic buildings and a Surveyor, mining with first shipment. The patient will receive a drug information handout for each medication shipped and additional FDA Medication Guides as required.       DISEASE/MEDICATION-SPECIFIC INFORMATION        For patients on injectable medications: Patient currently has 0 doses left.  Next injection is scheduled for 12/10/21.    SPECIALTY MEDICATION ADHERENCE     Medication Adherence    Patient reported X missed doses in the last month: 0  Specialty Medication: humira  Patient is on additional specialty medications: No                                Were doses missed due to medication being on hold? No        REFERRAL TO PHARMACIST     Referral to the pharmacist: Not needed      [facility]     Shipping address confirmed in Epic.     Delivery Scheduled: Yes, Expected medication delivery date: 12/07/21.     Medication will be delivered via UPS to the prescription address in Epic WAM.    Quintella Reichert   [facility] Specialty Technician

## 2021-12-06 MED FILL — HUMIRA SYRINGE CITRATE FREE 40 MG/0.4 ML: SUBCUTANEOUS | 28 days supply | Qty: 8 | Fill #2

## 2021-12-30 NOTE — Unmapped (Signed)
[facility] Refill Coordination Note    Specialty Medication(s) to be Shipped:   Inflammatory Disorders: Humira    Other medication(s) to be shipped: No additional medications requested for fill at this time     Debbie Gregory, DOB: [birth year]  Phone: [phone] (home)       All above HIPAA information was verified with patient.     Was a Nurse, learning disability used for this call? No    Completed refill call assessment today to schedule patient's medication shipment from the [facility] [phone]).  All relevant notes have been reviewed.     Specialty medication(s) and dose(s) confirmed: Regimen is correct and unchanged.   Changes to medications: Meilin reports no changes at this time.  Changes to insurance: No  New side effects reported not previously addressed with a pharmacist or physician: None reported  Questions for the pharmacist: No    Confirmed patient received a Conservation officer, historic buildings and a Surveyor, mining with first shipment. The patient will receive a drug information handout for each medication shipped and additional FDA Medication Guides as required.       DISEASE/MEDICATION-SPECIFIC INFORMATION        For patients on injectable medications: Patient currently has 1 doses left.  Next injection is scheduled for 12/30/21 .    SPECIALTY MEDICATION ADHERENCE     Medication Adherence    Patient reported X missed doses in the last month: 0  Specialty Medication: HUMIRA(CF) 40 mg/0.4 mL  Patient is on additional specialty medications: No  Patient is on more than two specialty medications: No  Any gaps in refill history greater than 2 weeks in the last 3 months: no  Demonstrates understanding of importance of adherence: yes                          Were doses missed due to medication being on hold? No        REFERRAL TO PHARMACIST     Referral to the pharmacist: Not needed      [facility]     Shipping address confirmed in Epic.     Delivery Scheduled: Yes, Expected medication delivery date: 01/05/22 .     Medication will be delivered via UPS to the prescription address in Epic WAM.    Ricci Barker   [facility] Specialty Technician

## 2022-01-04 MED FILL — HUMIRA SYRINGE CITRATE FREE 40 MG/0.4 ML: SUBCUTANEOUS | 28 days supply | Qty: 8 | Fill #3

## 2022-01-25 NOTE — Unmapped (Signed)
[facility] Refill Coordination Note    Debbie Gregory, DOB: 05-31-1990  Phone: [phone] (home)       All above HIPAA information was verified with patient.         01/24/2022    10:59 PM   Specialty Rx Medication Refill Questionnaire   Which Medications would you like refilled and shipped? I still have one humira box left of two syringes for my injection date of 01/27/22. My next injection date is 1/26   Please list all current allergies: N/A   Have you missed any doses in the last 30 days? No   Have you had any changes to your medication(s) since your last refill? No   How many days remaining of each medication do you have at home? 1 day   If receiving an injectable medication, next injection date is 01/27/2022   Have you experienced any side effects in the last 30 days? No   Please enter the full address (street address, city, state, zip code) where you would like your medication(s) to be delivered to. [address] [place] [address]   Please specify on which day you would like your medication(s) to arrive. Note: if you need your medication(s) within 3 days, please call the pharmacy to schedule your order at [phone]  02/01/2022   Has your insurance changed since your last refill? No   Would you like a pharmacist to call you to discuss your medication(s)? No   Do you require a signature for your package? (Note: if we are billing Medicare Part B or your order contains a controlled substance, we will require a signature) No         Completed refill call assessment today to schedule patient's medication shipment from the [facility] [phone]).  All relevant notes have been reviewed.       Confirmed patient received a Conservation officer, historic buildings and a Surveyor, mining with first shipment. The patient will receive a drug information handout for each medication shipped and additional FDA Medication Guides as required.         REFERRAL TO PHARMACIST     Referral to the pharmacist: Not needed      [facility]     Shipping address confirmed in Epic.     Delivery Scheduled: Yes, Expected medication delivery date: 02/01/22.     Medication will be delivered via UPS to the prescription address in Epic WAM.    Arnold Long, PharmD   [facility] Specialty Pharmacist

## 2022-01-31 MED FILL — HUMIRA SYRINGE CITRATE FREE 40 MG/0.4 ML: SUBCUTANEOUS | 28 days supply | Qty: 8 | Fill #4

## 2022-02-05 NOTE — Unmapped (Signed)
Dermatology Note    Assessment and Plan:      Hidradenitis Suppurativa  Previous therapies: Brodalumab, Doxycycline,Minocycline, Clindamycin, Humira, Spironolactone, Cosentyx, Remicade, Simponi, Siliq  - We discussed the typical natural history, pathogenesis, treatment options, and expected course as well as the relapsing and sometimes recalcitrant nature of the disease.   - Discussed possible therapies including Stelara every 4 weeks in addition to the Rinvoq vs stopping both and restarting Humira since patient had decent results for a while on it  - After discussion of risks and benefits, patient elected to stop Rinvoq and restart Humira  - Continue Humira 80 mg injection once weekly   - Patient prefers to avoid oral antibiotics due to difficulty tolerating GI upset in past   - Once again discussed surgical treatment/deroofings of axillae in the future, though patient hesitant. She defers surgical treatment for now. Would be about 3cm in L axilla, and about 3cm in L axilla.    High risk medication use  - Labs reviewed from 05/02/21 (including TB) and within acceptable limits    The patient was advised to call for an appointment should any new, changing, or symptomatic lesions develop.     RTC: Return in about 6 months (around 08/07/2022) for follow up of HS. or sooner as needed   _________________________________________________________________      Chief Complaint     Chief Complaint   Patient presents with    Follow-up     4 month f/up: no new changes          HPI     Debbie Gregory is a 32 y.o. female who presents as a returning patient (last seen by Dr. Lyndal Pulley and Dr. Janyth Contes on 08/29/2021) to Dermatology for follow up of hidradenitis suppurativa. At last visit, patient was to restart Humira 80 mg injections once weekly.    Today, patient reports that she has not been developing new lesions and only had some activity in her underarms. She described that her left underarm has one lesion that was particularly painful and that her right underarm has been doing well. She notes there have been small lesions on her inner thighs as well.     She states her psoriasis is doing well.     Self-reported severity (0-5): 4  VAS pain today: 4  VAS average pain for the last month: 4  Requiring pain medication? Yes.  If so, what type/frequency? tylenol  How often in pain?  continuously  Level of odor (0-5): 3  Level of itching (0-5): 3  Dressing changes needed for drainage:Twice a day  How much drainage: a little drainage  Flare in the last month (Y/N)? No.  How long ago was the last flare? in last 6 months  Developing new lesions? less than monthly  Number of inflammatory lesions montly: 3-5  DLQI: 18  Current treatment: Humira     How helpful is the current treatment in managing the following aspects of your disease?  Not at all helpful Somewhat helpful Very helpful   Pain         Decreasing length of flares         Decreasing new lesions         Drainage         Decreasing frequency of flares         Decreasing severity of flares         Odor           The patient denies  any other new or changing lesions or areas of concern.     Pertinent Past Medical History     Social History:  Current or former smoker? current  Amount smoking: less than half ppd  How many years: Not answered  ED visits in the last 5 years? never  Difficulty affording medications? never  Marital Status: single  Living with some one? Yes.     Prior treatments:  Topical: Did not list which ones  Systemic: brodalumab, Doxycycline,Minocycline, Clindamycin, Humira, Spironolactone, Cosentyx, Remicade, Simponi, Siliq  Past surgical procedures: None  Past laser procedures: None    Past Medical History, Family History, Social History, Medication List, Allergies, and Problem List were reviewed in the rooming section of Epic.     ROS: Other than symptoms mentioned in the HPI, no fevers, chills, or other skin complaints    Physical Examination     Gen: Well-appearing patient, appropriate, interactive, in no acute distress  SKIN (Focal Skin Exam): Per patient request, examination of bilateral axillae, chest, abdomen, bilateral thighs, groin, buttocks, and external genitalia was performed    location Abscess Inflamed nodule Non-inflamed nodule Draining sinus Non-draining Sinus Hurley % scar   R axilla  1  1 1 2     L axilla  1  1  2     R inframammary  1    1    L inframammary     1 2    Intermammary          Pubic          R inguinal          R thigh          L inguinal          L thigh          Scrotum/Vulva          Perianal          R buttock          L buttock          Other (list)                      AN count (total sum of abscess and inflammatory nodule): 3    - Sites not commented on demonstrate normal findings.    Scribe's Attestation: Elsie Stain, MD obtained and performed the history, physical exam and medical decision making elements that were entered into the chart.  Signed by Cherlynn Kaiser, Scribe, on February 06, 2022 at 1:26 PM..    ----------------------------------------------------------------------------------------------------------------------  February 06, 2022 9:36 PM. Documentation assistance provided by the Scribe. I was present during the time the encounter was recorded. The information recorded by the Scribe was done at my direction and has been reviewed and validated by me.  ----------------------------------------------------------------------------------------------------------------------       (Approved Template 09/22/2019)

## 2022-02-05 NOTE — Unmapped (Addendum)
For your HS:  - Continue humira injections once weekly      You are welcome to join the [facility] for HS of the [employer] group online at [email or web address] or in-person at our regular meetings.  You can textHS to [phone] for meeting reminders or join the group online for regular updates.  This can be a great opportunity to interact and learn from other patients and help work with the HS community.  We hope to see your there!    Hidradentis Suppurativa (pronounced ???high-drad-en-eye-tis/sup-your-uh-tee-vah???) is a chronic disease of hair follicles.  The lesions occur most commonly on areas of skin-to-skin contact: under the arms (axillary area), in the groin, around the buttocks, in the region around the anus and genitals, and on the skin between and under the breasts. In women, the underarms, groin, and breast areas are most commonly affected. Men most often have HS lesions on the buttocks and under the arms and may also have HS at the back of the neck and behind and around the ears.    What does HS look and feel like?   The first thing that someone with HS notices is a tender, raised, red bump that looks like an under-the-skin pimple or boil. Sometimes HS lesions have two or more ???heads.???  In mild disease only an occasional boil or abscess may occur, but in more active disease there can be many new lesions every month.  Some abscesses can become larger and may open and drain pus.  Bleeding and increased odor can also occur. In severe disease, deeper abscesses develop and may connect with each other under the skin to form tunnel-like tracts (sinuses, fistulas).  These may drain constantly, or may temporarily improve and then usually begin draining again over time.  In people who have had sinus tracts for some time, scars form that feel like ropes under the skin. In the very worst cases, networks of sinus tracts can form deeper in the body, including the muscle and other tissues. Many people with severe HS have scars that can limit their ability to freely move their arms or legs, though this is very unlikely for most patients.     Clinicians usually classify or ???grade??? HS using the [facility] staging system according to the severity of the disease for each body location:   Trinity stage I: one or more abscesses are present, but no sinus tracts have formed and no scars have developed   Doreene Adas stage II: one or more abscesses are present that resolve and recur; on sinus tract can be present and scarring is seen   Doreene Adas stage III: many abscesses and more than one sinus tract is present with extensive scars.    What causes HS?  The cause of HS is not completely understood.  It seems to be a disorder of hair follicles and often many family members are affected so genetics probably play a strong role.  Bacteria are often present and may make the disease worse, but infection does not seem to be the main cause. Hormones are also likely play a role since the condition typically starts around puberty when hair follicles under the arms and in the groin start to change.  It can sometimes flare with menstrual cycles in women as well.  In most cases it lasts for decades and starts to improve to some extent in the late 30s and 40s as long as many fistulas have not already formed.  Women are three times more likely than  men to develop HS.    Other factors are known to contribute to HS flaring or becoming worse, though they are likely not the main causes. The factors most commonly associated with HS include:   Cigarette smoking - Stopping smoking will likely not cure the disease, but likely is helpful in reducing how much and how often it flares and may prevent it from getting as bad over time.   Higher weight - HS may occur even in people that are not overweight, but it is much more common in patients that are.  There is some evidence that losing weight and eating a diet low in sugars and fats may be helpful in improving hidradenitis, though this is not helpful for everyone.  Working with a nutritionist may be an important way to help with this and is something your physician can help coordinate    Hidradenitis is not contagious.  It is not caused by a problem with personal hygiene or any other activity or behavior of those with the disease.    How can your doctor help you treat your hidradenitis?  Clinicians use both medication and surgery to treat HS. The choice of treatment--or combination of treatments--is made according to an individual patient???s needs. Clinicians consider several factors in determining the most appropriate plan for therapy:   Severity of disease - medications and some laser treatments are usually able to control disease best when fistulas are not present.  Fistulas typically require surgery.   Extent and location of disease   Chronicity (how often the lesions recur)    A number of different surgical methods have been developed that are useful for certain patients under particular circumstances. These can be done with local numbing and healing at home for some areas when disease is not too extensive with relatively brief recovery times.  In more extensive disease there may be a need for larger excisions under general anesthesia with healing time in the hospital and prolonged recovery periods for better disease control.      In addition, many medical treatments have been tried--some with more success than others. No medication is effective for all patients, and you and your doctor may have to try several different treatments or combinations of treatments before you find the treatment plan that works best for you.  The goals of therapy with medications that are either topical (used on the skin) or systemic (taken by mouth) are:  1. to clear the lesions or at least reduce their number and extent, and  2. to prevent new lesions from forming.  3. To reduce pain, drainage, and odor  Some of the types of medications commonly used are antibacterial skin washes and the topical antibiotics to prevent secondary infections and corticosteroid injections into the lesions to reduce inflammation.     Other medications that may be used include retinoids (similar to Accutane), drugs that effect how hormones and hair follicles interact, drugs that affect your immune system (such as methotrexate, adalimumab/Humira, and Remicaid/infliximab), steroids, and oral antibiotics.    Lasers that destroy hair follicles can also be helpful since they reduce the hair follicles that cause the problems.  Multiple treatments are typically required over time and there is some discomfort associated with treatment, but it is typically very fast and well-tolerated.    It is very important to realize that hidradenitis cannot usually be completely cured with any single medication or surgical procedure.  It is a disease that can be very stubborn and difficult to  control, but with good treatment a lot of improvement and sometimes temporary remissions can be obtained. Poorly controlled disease can cause more fistulas to form and make managing the disease much more difficult over time so it is important to seek care to reduce major flares.  Surgery can provide a long term cure in some areas, though the disease can start again or continue in nearby areas.  A dermatologist is often the best person to help coordinate disease treatment, and sometimes other surgeons, pain specialists, other specialists, and nutritionists may be part of the treatment team.    For severe disease, the first goal is often to reduce pain and symptoms with medicines so that the disease feels more stable. Once it's stable, we often start thinking about how to address areas that have completely gotten better with surgery if they are still causing problems.    What can you do to help your HS?  1. Stopping smoking is hard and may not fix everything, but it may be a step in the right direction. We or your primary care physician can provide resources to help stop if you are interested.  2. Follow a healthy diet and try to achieve a healthy weight.  Some other self-help measures are:   Keep your skin cool and dry (becoming overheated and sweating can contribute to an HS flare)   To reduce the pain of cysts or nodules or to help them to drain, apply hot compresses or soak in hot water for 10 minutes at a time (use a clean washcloth or a teabag soaked in hot water)   For female patients, cotton underwear that does not have tight elastic in the groin can be helpful.  Boyshort, brief, or boxer style underwear may be a better option as friction on hair follicles in affected areas can be a major trigger in some patients.  These can be easily found on [place] or with some retailers.  Fruit of the Loom and Underworks are two brands that are sometimes recommended.    Finally, know that you are not alone. Coping with the pain and other symptoms of HS can be very difficult, so it may be helpful to connect with others who live with HS. Patient groups and networks can be sources of important information and support. Some internet resources for information and connections are provided below.    Psychologytoday.com is a resource to find psychologists and therapists that can help support you in your are     [email or web address] can help connect with sexual health resources and counselors    Resources for Information    The Hidradenitis Suppurativa Foundation: A nonprofit organized by a group of physicians interested in treating and advancing research in hidradenitis suppurativa.  This group advocates for better care and research for hidradenitis and has educational materials put together specifically for patients that have been reviewed and produced by doctors and people with hidradenitis.    American Academy of Dermatology  ARanked.fi    [employer]  ElevatorPitchers.de.html  NORD: [employer] for [employer]  https://www.rarediseases.org/rare-disease-information/rare-diseases/byID/358/viewAbstract  Trials of new medications for HS  Https://www.clinicaltrials.gov

## 2022-02-06 ENCOUNTER — Ambulatory Visit: Admit: 2022-02-06 | Discharge: 2022-02-07 | Payer: PRIVATE HEALTH INSURANCE

## 2022-02-06 DIAGNOSIS — L910 Hypertrophic scar: Principal | ICD-10-CM

## 2022-02-06 DIAGNOSIS — Z79899 Other long term (current) drug therapy: Principal | ICD-10-CM

## 2022-02-06 DIAGNOSIS — L732 Hidradenitis suppurativa: Principal | ICD-10-CM

## 2022-02-06 NOTE — Unmapped (Deleted)
Self-reported severity (0-5): 4  VAS pain today: 4  VAS average pain for the last month: 4  Requiring pain medication? Yes.  If so, what type/frequency? tylenol  How often in pain?  continuously  Level of odor (0-5): 3  Level of itching (0-5): 3  Dressing changes needed for drainage:Twice a day  How much drainage: a little drainage  Flare in the last month (Y/N)? No.  How long ago was the last flare? in last 6 months  Developing new lesions? less than monthly  Number of inflammatory lesions montly: 3-5  DLQI: 18  Current treatment: humira   How helpful is the current treatment in managing the following aspects of your disease?  Not at all helpful Somewhat helpful Very helpful   Pain x     Decreasing length of flares  x    Decreasing new lesions  x    Drainage  x    Decreasing frequency of flares  x    Decreasing severity of flares  x    Odor x

## 2022-02-27 NOTE — Unmapped (Signed)
[facility] Refill Coordination Note    Debbie Gregory, DOB: [birth year]  Phone: [phone] (home)       All above HIPAA information was verified with patient.         02/24/2022    11:04 PM   Specialty Rx Medication Refill Questionnaire   Which Medications would you like refilled and shipped? Humira, ive used the remaining box of 2 syringes tonight (feb. 16) and am due for 2 more injections next friday the 23rd   Please list all current allergies: N/a   Have you missed any doses in the last 30 days? No   Have you had any changes to your medication(s) since your last refill? No   How many days remaining of each medication do you have at home? 0   If receiving an injectable medication, next injection date is 03/03/2022   Have you experienced any side effects in the last 30 days? No   Please enter the full address (street address, city, state, zip code) where you would like your medication(s) to be delivered to. [address], [place] [address]   Please specify on which day you would like your medication(s) to arrive. Note: if you need your medication(s) within 3 days, please call the pharmacy to schedule your order at [phone]  03/01/2022   Has your insurance changed since your last refill? No   Would you like a pharmacist to call you to discuss your medication(s)? No   Do you require a signature for your package? (Note: if we are billing Medicare Part B or your order contains a controlled substance, we will require a signature) No         Completed refill call assessment today to schedule patient's medication shipment from the [facility] [phone]).  All relevant notes have been reviewed.       Confirmed patient received a Conservation officer, historic buildings and a Surveyor, mining with first shipment. The patient will receive a drug information handout for each medication shipped and additional FDA Medication Guides as required.         REFERRAL TO PHARMACIST     Referral to the pharmacist: Not needed      [facility]     Shipping address confirmed in Epic.     Delivery Scheduled: Yes, Expected medication delivery date: 03/01/22.     Medication will be delivered via UPS to the prescription address in Epic WAM.    Tobi Bastos, PharmD   [facility] Specialty Pharmacist

## 2022-02-28 MED FILL — HUMIRA SYRINGE CITRATE FREE 40 MG/0.4 ML: SUBCUTANEOUS | 28 days supply | Qty: 8 | Fill #5

## 2022-03-20 NOTE — Unmapped (Signed)
[facility] Refill Coordination Note    Specialty Medication(s) to be Shipped:   Inflammatory Disorders: Humira    Other medication(s) to be shipped: No additional medications requested for fill at this time     Debbie Gregory, DOB: 04/18/1990  Phone: [phone] (home)       All above HIPAA information was verified with patient.     Was a Nurse, learning disability used for this call? No    Completed refill call assessment today to schedule patient's medication shipment from the [facility] [phone]).  All relevant notes have been reviewed.     Specialty medication(s) and dose(s) confirmed: Regimen is correct and unchanged.   Changes to medications: Kiare reports no changes at this time.  Changes to insurance: No  New side effects reported not previously addressed with a pharmacist or physician: None reported  Questions for the pharmacist: No    Confirmed patient received a Conservation officer, historic buildings and a Surveyor, mining with first shipment. The patient will receive a drug information handout for each medication shipped and additional FDA Medication Guides as required.       DISEASE/MEDICATION-SPECIFIC INFORMATION        For patients on injectable medications: Patient currently has 0 doses left.  Next injection is scheduled for 03/31/22.    SPECIALTY MEDICATION ADHERENCE     Medication Adherence    Patient reported X missed doses in the last month: 0  Specialty Medication: Humira (CF) 40mg /0.4 mL  Patient is on additional specialty medications: No  Patient is on more than two specialty medications: No              Were doses missed due to medication being on hold? No        REFERRAL TO PHARMACIST     Referral to the pharmacist: Not needed      [facility]     Shipping address confirmed in Epic.     Delivery Scheduled: Yes, Expected medication delivery date: 03/28/22.     Medication will be delivered via UPS to the prescription address in Epic WAM.    Ernestine Mcmurray   [facility] Specialty Technician

## 2022-03-24 DIAGNOSIS — Z79899 Other long term (current) drug therapy: Principal | ICD-10-CM

## 2022-03-24 DIAGNOSIS — L732 Hidradenitis suppurativa: Principal | ICD-10-CM

## 2022-03-27 NOTE — Unmapped (Signed)
Debbie Gregory 's HUMIRA(CF) 40 mg/0.4 mL injection (adalimumab) shipment will be delayed as a result of prior authorization being required by the patient's insurance.     I have reached out to the patient  at [phone] and communicated the delay. We will call the patient back to reschedule the delivery upon resolution. We have not confirmed the new delivery date.

## 2022-03-27 NOTE — Unmapped (Signed)
Debbie Gregory 's HUMIRA(CF) 40 mg/0.4 mL injection (adalimumab) shipment will be delivered on 03/29/22

## 2022-03-28 MED FILL — HUMIRA SYRINGE CITRATE FREE 40 MG/0.4 ML: SUBCUTANEOUS | 28 days supply | Qty: 8 | Fill #6

## 2022-04-20 NOTE — Unmapped (Signed)
[facility] Refill Coordination Note    Specialty Medication(s) to be Shipped:   Inflammatory Disorders: Humira    Other medication(s) to be shipped: No additional medications requested for fill at this time     Debbie Gregory, DOB: 1990/02/20  Phone: [phone] (home)       All above HIPAA information was verified with patient.     Was a Nurse, learning disability used for this call? No    Completed refill call assessment today to schedule patient's medication shipment from the [facility] [phone]).  All relevant notes have been reviewed.     Specialty medication(s) and dose(s) confirmed: Regimen is correct and unchanged.   Changes to medications: Debbie Gregory reports no changes at this time.  Changes to insurance: No  New side effects reported not previously addressed with a pharmacist or physician: None reported  Questions for the pharmacist: No    Confirmed patient received a Conservation officer, historic buildings and a Surveyor, mining with first shipment. The patient will receive a drug information handout for each medication shipped and additional FDA Medication Guides as required.       DISEASE/MEDICATION-SPECIFIC INFORMATION        For patients on injectable medications: Patient currently has 2 doses left.  Next injection is scheduled for 04/21/22.    SPECIALTY MEDICATION ADHERENCE     Medication Adherence    Patient reported X missed doses in the last month: 0  Specialty Medication: HUmira  Patient is on additional specialty medications: No  Informant: patient              Were doses missed due to medication being on hold? No        REFERRAL TO PHARMACIST     Referral to the pharmacist: Not needed      [facility]     Shipping address confirmed in Epic.     Delivery Scheduled: Yes, Expected medication delivery date: 04/25/22.     Medication will be delivered via UPS to the prescription address in Epic WAM.    Alwyn Pea   [facility] Specialty Technician

## 2022-04-24 MED FILL — HUMIRA SYRINGE CITRATE FREE 40 MG/0.4 ML: SUBCUTANEOUS | 28 days supply | Qty: 8 | Fill #7

## 2022-05-09 MED ORDER — CLOTRIMAZOLE-BETAMETHASONE 1 %-0.05 % TOPICAL CREAM
Freq: Two times a day (BID) | TOPICAL | 1 refills | 0.00000 days | Status: CP
Start: 2022-05-09 — End: 2023-05-09

## 2022-05-16 NOTE — Unmapped (Signed)
[facility] Pharm[facility] Refill Coordination Note    Debbie Gregory, DOB: 1990/05/09  Phone: [phone] (home)       All above HIPAA information was verified with patient.         05/16/2022     2:57 PM   Specialty Rx Medication Refill Questionnaire   Which Medications would you like refilled and shipped? Humira. I have one box left of two injections that are due 5/10 and my next dose is 5/17   Please list all current allergies: N/a   Have you missed any doses in the last 30 days? No   Have you had any changes to your medication(s) since your last refill? No   How many days remaining of each medication do you have at home? One   If receiving an injectable medication, next injection date is 05/19/2022   Have you experienced any side effects in the last 30 days? No   Please enter the full address (street address, city, state, zip code) where you would like your medication(s) to be delivered to. [address] [place] [address]   Please specify on which day you would like your medication(s) to arrive. Note: if you need your medication(s) within 3 days, please call the pharmacy to schedule your order at [phone]  05/24/2022   Has your insurance changed since your last refill? No   Would you like a pharmacist to call you to discuss your medication(s)? No   Do you require a signature for your package? (Note: if we are billing Medicare Part B or your order contains a controlled substance, we will require a signature) No         Completed refill call assessment today to schedule patient's medication shipment from the [facility] [phone]).  All relevant notes have been reviewed.       Confirmed patient received a Conservation officer, historic buildings and a Surveyor, mining with first shipment. The patient will receive a drug information handout for each medication shipped and additional FDA Medication Guides as required.         REFERRAL TO PHARMACIST     Referral to the pharmacist: Not needed      [facility]     Shipping address confirmed in Epic.     Delivery Scheduled: Yes, Expected medication delivery date: 05/24/2022.     Medication will be delivered via UPS to the prescription address in Epic WAM.    Debbie Gregory   [facility] Specialty Technician

## 2022-05-23 MED FILL — HUMIRA SYRINGE CITRATE FREE 40 MG/0.4 ML: SUBCUTANEOUS | 28 days supply | Qty: 8 | Fill #8

## 2022-05-23 MED FILL — EMPTY CONTAINER: 120 days supply | Qty: 1 | Fill #1

## 2022-06-09 NOTE — Unmapped (Signed)
[facility] Pharm[facility] Refill Coordination Note    Debbie Gregory, DOB: 03-03-1990  Phone: [phone] (home)       All above HIPAA information was verified with patient.         06/06/2022    11:00 AM   Specialty Rx Medication Refill Questionnaire   Which Medications would you like refilled and shipped? Humira. I have two more boxes left with the injection dates of 05/31 and 06/07   Please list all current allergies: N/A   Have you missed any doses in the last 30 days? No   Have you had any changes to your medication(s) since your last refill? No   How many days remaining of each medication do you have at home? I have enough boxes for two more injection days   If receiving an injectable medication, next injection date is 06/23/2022   Have you experienced any side effects in the last 30 days? No   Please enter the full address (street address, city, state, zip code) where you would like your medication(s) to be delivered to. [address] [place] [address]   Please specify on which day you would like your medication(s) to arrive. Note: if you need your medication(s) within 3 days, please call the pharmacy to schedule your order at [phone]  06/20/2022   Has your insurance changed since your last refill? No   Would you like a pharmacist to call you to discuss your medication(s)? No   Do you require a signature for your package? (Note: if we are billing Medicare Part B or your order contains a controlled substance, we will require a signature) No         Completed refill call assessment today to schedule patient's medication shipment from the [facility] [phone]).  All relevant notes have been reviewed.       Confirmed patient received a Conservation officer, historic buildings and a Surveyor, mining with first shipment. The patient will receive a drug information handout for each medication shipped and additional FDA Medication Guides as required.         REFERRAL TO PHARMACIST     Referral to the pharmacist: Not needed      [facility]     Shipping address confirmed in Epic.     Delivery Scheduled: Yes, Expected medication delivery date: 6/11.     Medication will be delivered via UPS to the prescription address in Epic WAM.    Alwyn Pea   [facility] Specialty Technician

## 2022-06-19 MED FILL — HUMIRA SYRINGE CITRATE FREE 40 MG/0.4 ML: SUBCUTANEOUS | 28 days supply | Qty: 8 | Fill #9

## 2022-07-17 NOTE — Unmapped (Signed)
[facility] Refill Coordination Note    Debbie Gregory, DOB: [birth year]  Phone: [phone] (home)       All above HIPAA information was verified with patient.         07/12/2022    10:56 AM   Specialty Rx Medication Refill Questionnaire   Which Medications would you like refilled and shipped? Humira, i have one box left of two syringes that are due by this friday 6/5   Please list all current allergies: N/a   Have you missed any doses in the last 30 days? No   Have you had any changes to your medication(s) since your last refill? No   How many days remaining of each medication do you have at home? I have enough for one more dose   If receiving an injectable medication, next injection date is 06/14/2022   Have you experienced any side effects in the last 30 days? No   Please enter the full address (street address, city, state, zip code) where you would like your medication(s) to be delivered to. [address] [place] [address]   Please specify on which day you would like your medication(s) to arrive. Note: if you need your medication(s) within 3 days, please call the pharmacy to schedule your order at [phone]  07/19/2022   Has your insurance changed since your last refill? No   Would you like a pharmacist to call you to discuss your medication(s)? No   Do you require a signature for your package? (Note: if we are billing Medicare Part B or your order contains a controlled substance, we will require a signature) No         Completed refill call assessment today to schedule patient's medication shipment from the [facility] [phone]).  All relevant notes have been reviewed.       Confirmed patient received a Conservation officer, historic buildings and a Surveyor, mining with first shipment. The patient will receive a drug information handout for each medication shipped and additional FDA Medication Guides as required.         REFERRAL TO PHARMACIST     Referral to the pharmacist: Not needed      [facility]: Debbie Gregory     Shipping address confirmed in Epic.     Delivery Scheduled: Yes, Expected medication delivery date: 7/10.     Medication will be delivered via UPS to the prescription address in Epic WAM.    Debbie Gregory   [facility] Specialty Technician

## 2022-07-18 MED FILL — HUMIRA SYRINGE CITRATE FREE 40 MG/0.4 ML: SUBCUTANEOUS | 28 days supply | Qty: 8 | Fill #10

## 2022-08-07 ENCOUNTER — Ambulatory Visit: Admit: 2022-08-07 | Discharge: 2022-08-07 | Payer: PRIVATE HEALTH INSURANCE

## 2022-08-07 DIAGNOSIS — Z79899 Other long term (current) drug therapy: Principal | ICD-10-CM

## 2022-08-07 DIAGNOSIS — L732 Hidradenitis suppurativa: Principal | ICD-10-CM

## 2022-08-07 MED ORDER — AMOXICILLIN 875 MG-POTASSIUM CLAVULANATE 125 MG TABLET
ORAL_TABLET | Freq: Two times a day (BID) | ORAL | 0 refills | 28.00000 days | Status: CP
Start: 2022-08-07 — End: 2022-09-04

## 2022-08-07 MED ORDER — USTEKINUMAB 90 MG/ML SUBCUTANEOUS SYRINGE
SUBCUTANEOUS | 11 refills | 28.00000 days | Status: CP
Start: 2022-08-07 — End: ?

## 2022-08-07 NOTE — Unmapped (Deleted)
Self-reported severity (0-5): 4  VAS pain today: 4  VAS average pain for the last month: {numbers 0-10:5044}  Requiring pain medication? Yes.  If so, what type/frequency? tylenol  How often in pain?  continuously  Level of odor (0-5): 2  Level of itching (0-5): {0-5 selection:53296}  Dressing changes needed for drainage:Twice a day  How much drainage: some drainage  Flare in the last month (Y/N)? No.  How long ago was the last flare? in last 6 months  Developing new lesions? once a month  Number of inflammatory lesions montly: 3-5  DLQI: 24  Current treatment:humira     How helpful is the current treatment in managing the following aspects of your disease?  Not at all helpful Somewhat helpful Very helpful   Pain x     Decreasing length of flares  x    Decreasing new lesions x     Drainage x     Decreasing frequency of flares  x    Decreasing severity of flares  x    Odor x

## 2022-08-07 NOTE — Unmapped (Signed)
Dermatology Note     Assessment and Plan:      Hidradenitis Suppurativa, chronic, flaring, not at goal  Previous therapies: Brodalumab, Doxycycline,Minocycline, Clindamycin, Humira, Spironolactone, Cosentyx, Remicade, Simponi, Siliq  - We discussed the typical natural history, pathogenesis, treatment options, and expected course as well as the relapsing and sometimes recalcitrant nature of the disease.   - Discussed possible therapies including surgical procedures/deroofings vs. Stelara q4 weeks in addition to Humira vs. Bimekizumab   - After discussion of risks and benefits, patient elected to continue Humira and start Stelara  - Continue Humira 80 mg injection once weekly   - Start Stelara 90mg  q4 weeks  - Start Augmentin 875-125mg /tablet BID x 4 weeks  - Once again discussed surgical treatment/deroofings of axillae in the future, though patient still hesitant. She defers surgical treatment for now. Would be about 3cm in L axilla, and about 3cm in L axilla.    High risk medication use  - Labs reviewed from 05/02/21 (including TB) and within acceptable limits  - Labs ordered today:    - Quantiferon TB Gold Plus    The patient was advised to call for an appointment should any new, changing, or symptomatic lesions develop.     RTC: Return in about 6 months (around 02/07/2023). or sooner as needed   _________________________________________________________________      Chief Complaint     Chief Complaint   Patient presents with    hs     Pt is here today for evaluation of hs    Duration:  Location:armpits under breast and stomach groin buttocks  Current ZO:XWRUEA  Status:worse    Symptoms:Local-bleeding from the lesion and itching overlying  snd bleeding the lesion General-none          HPI     Debbie Gregory is a 32 y.o. female who presents as a returning patient (last seen 02/06/2022) to Dermatology for follow up of hidradenitis suppurativa.     At last visit, continued Humira 80mg  injection once weekly.    Today:  - at first, seemed like Humira was working well  - more recently, has had increased flaring in groin and axillae  - using clotrimazole-betamethasone ~1x/day for the last 1.5-2 months which has helped with irritation around sores but not w/ flares themselves    Self-reported severity (0-5): 4  VAS pain today: 4  VAS average pain for the last month:   Requiring pain medication? Yes.  If so, what type/frequency? tylenol  How often in pain?  continuously  Level of odor (0-5): 2  Level of itching (0-5):   Dressing changes needed for drainage:Twice a day  How much drainage: some drainage  Flare in the last month (Y/N)? No.  How long ago was the last flare? in last 6 months  Developing new lesions? once a month  Number of inflammatory lesions montly: 3-5  DLQI: 24  Current treatment:humira     How helpful is the current treatment in managing the following aspects of your disease?  Not at all helpful Somewhat helpful Very helpful   Pain x     Decreasing length of flares  x    Decreasing new lesions x     Drainage x     Decreasing frequency of flares  x    Decreasing severity of flares  x    Odor x          The patient denies any other new or changing lesions or areas of concern.  Pertinent Past Medical History     Social History:  Current or former smoker? current  Amount smoking: less than half ppd  How many years: Not answered  ED visits in the last 5 years? never  Difficulty affording medications? never  Marital Status: single  Living with some one? Yes.     Prior treatments:  Topical: Did not list which ones  Systemic: brodalumab, Doxycycline,Minocycline, Clindamycin, Humira, Spironolactone, Cosentyx, Remicade, Simponi, Siliq  Past surgical procedures: None  Past laser procedures: None    No history of skin cancer    Problem List       Hidradenitis suppurativa - Primary    Relevant Medications    amoxicillin-clavulanate (AUGMENTIN) 875-125 mg per tablet    ustekinumab (STELARA) 90 mg/mL Syrg syringe     Family History:   Negative for melanoma    Past Medical History, Family History, Social History, Medication List, Allergies, and Problem List were reviewed in the rooming section of Epic.     ROS: Other than symptoms mentioned in the HPI, no fevers, chills, or other skin complaints    Physical Examination     GENERAL: Well-appearing female in no acute distress, resting comfortably.  NEURO: Alert and oriented, answers questions appropriately  PSYCH: Normal mood and affect  RESP: No increased work of breathing  SKIN (Focal Skin Exam): Per patient request, examination of bilateral axillae, chest, abdomen, bilateral thighs, groin, buttocks, and external genitalia was performed     location Abscess Inflamed nodule Non-inflamed nodule Draining sinus Non-draining Sinus Hurley % scar   R axilla   1   1 1 2      L axilla   1   1 1 2      R inframammary          1     L inframammary               Intermammary                 Pubic          1       R inguinal    1    1         R thigh                 L inguinal    1      1       L thigh                 Scrotum/Vulva                 Perianal                 R buttock    1             L buttock                 Other (list)                                      AN count (total sum of abscess and inflammatory nodule): 5    All areas not commented on are within normal limits or unremarkable      (Approved Template 09/22/2019)

## 2022-08-08 NOTE — Unmapped (Signed)
I saw and evaluated the patient, participating in the key portions of the service.  I reviewed the resident’s note.  I agree with the resident’s findings and plan. Kain Milosevic J Kentrail Shew, MD

## 2022-08-09 NOTE — Unmapped (Signed)
Initialed PA on CMM; KEY: [id number] Pending: Approval/Denial         Will make patient aware of PA status via MyChart message.

## 2022-08-10 LAB — QUANTIFERON TB GOLD PLUS
QUANTIFERON ANTIGEN 1 MINUS NIL: 0.02 [IU]/mL
QUANTIFERON ANTIGEN 2 MINUS NIL: 0.02 [IU]/mL
QUANTIFERON MITOGEN: 10 [IU]/mL
QUANTIFERON TB GOLD PLUS: NEGATIVE
QUANTIFERON TB NIL VALUE: 0 [IU]/mL

## 2022-08-10 LAB — TB MITOGEN: TB MITOGEN VALUE: 10

## 2022-08-10 LAB — TB NIL: TB NIL VALUE: 0

## 2022-08-10 LAB — TB AG2: TB AG2 VALUE: 0.02

## 2022-08-10 LAB — TB AG1: TB AG1 VALUE: 0.02

## 2022-08-11 NOTE — Unmapped (Signed)
[facility] Refill Coordination Note    Debbie Gregory, DOB: 13-Mar-1990  Phone: [phone] (home)       All above HIPAA information was verified with patient.         08/11/2022    11:12 AM   Specialty Rx Medication Refill Questionnaire   Which Medications would you like refilled and shipped? Humira   Please list all current allergies: N/a   Have you missed any doses in the last 30 days? No   Have you had any changes to your medication(s) since your last refill? Yes   Please list your medication(s) changes below. Stelara has been added to take along with humira   How many days remaining of each medication do you have at home? I have one box left of two injections im taking today   If receiving an injectable medication, next injection date is 08/11/2022   Have you experienced any side effects in the last 30 days? No   Please enter the full address (street address, city, state, zip code) where you would like your medication(s) to be delivered to. [address] [place] [address]   Please specify on which day you would like your medication(s) to arrive. Note: if you need your medication(s) within 3 days, please call the pharmacy to schedule your order at [phone]  08/16/2022   Has your insurance changed since your last refill? No   Would you like a pharmacist to call you to discuss your medication(s)? No   Do you require a signature for your package? (Note: if we are billing Medicare Part B or your order contains a controlled substance, we will require a signature) No         Completed refill call assessment today to schedule patient's medication shipment from the [facility] [phone]).  All relevant notes have been reviewed.       Confirmed patient received a Conservation officer, historic buildings and a Surveyor, mining with first shipment. The patient will receive a drug information handout for each medication shipped and additional FDA Medication Guides as required. REFERRAL TO PHARMACIST     Referral to the pharmacist: Not needed      [facility]     Shipping address confirmed in Epic.     Delivery Scheduled: Yes, Expected medication delivery date: 08/16/2022.     Medication will be delivered via UPS to the prescription address in Epic WAM.    Alwyn Pea   [facility] Specialty Technician

## 2022-08-14 NOTE — Unmapped (Signed)
Call from [facility].  LVM asking if loading dose is required for Stelara since starting it?  States can either send a Rx for loading dose to them explanting does require loading dose or fax to [phone] clarification that this does not need loading dose.

## 2022-08-14 NOTE — Unmapped (Signed)
Faxed Stelara clarification to [facility]. Clarified that this is correct dosing for Stelara and No loading needed.  We're doing a higher than typical dose at 90mg  every 4 weeks.    Received efax confirmation:  The message you sent to [id number] .[email or web address] at [phone], was delivered successfully on 08/14/2022 at 4:22:04 PM    JobID: [id number]

## 2022-08-15 MED FILL — HUMIRA SYRINGE CITRATE FREE 40 MG/0.4 ML: SUBCUTANEOUS | 28 days supply | Qty: 8 | Fill #11

## 2022-08-18 NOTE — Unmapped (Signed)
Received fax stating PA for Debbie Gregory has been denied. Letter to be scanned into chart under media tab.    Reason for denial:

## 2022-08-22 DIAGNOSIS — L732 Hidradenitis suppurativa: Principal | ICD-10-CM

## 2022-08-22 MED ORDER — USTEKINUMAB 90 MG/ML SUBCUTANEOUS SYRINGE
SUBCUTANEOUS | 11 refills | 28.00000 days | Status: CP
Start: 2022-08-22 — End: ?

## 2022-08-23 DIAGNOSIS — L732 Hidradenitis suppurativa: Principal | ICD-10-CM

## 2022-09-06 DIAGNOSIS — L732 Hidradenitis suppurativa: Principal | ICD-10-CM

## 2022-09-06 DIAGNOSIS — Z79899 Other long term (current) drug therapy: Principal | ICD-10-CM

## 2022-09-06 MED ORDER — HUMIRA SYRINGE CITRATE FREE 40 MG/0.4 ML
SUBCUTANEOUS | 11 refills | 28.00000 days | Status: CP
Start: 2022-09-06 — End: ?
  Filled 2022-09-13: qty 8, 28d supply, fill #0

## 2022-09-06 NOTE — Unmapped (Signed)
Prescription refill request for humira, Last office visit was 08/07/22    Please Advise

## 2022-09-08 NOTE — Unmapped (Signed)
[facility] Refill Coordination Note    Debbie Gregory, DOB: 12/17/1990  Phone: [phone] (home)       All above HIPAA information was verified with patient.         09/07/2022     4:00 PM   Specialty Rx Medication Refill Questionnaire   Which Medications would you like refilled and shipped? Humira   Please list all current allergies: N/a   Have you missed any doses in the last 30 days? No   Have you had any changes to your medication(s) since your last refill? No   How many days remaining of each medication do you have at home? I have enough for one more injection day on 8/30   If receiving an injectable medication, next injection date is 09/15/2022   Have you experienced any side effects in the last 30 days? No   Please enter the full address (street address, city, state, zip code) where you would like your medication(s) to be delivered to. [address] [place] [address]   Please specify on which day you would like your medication(s) to arrive. Note: if you need your medication(s) within 3 days, please call the pharmacy to schedule your order at [phone]  09/14/2022   Has your insurance changed since your last refill? No   Would you like a pharmacist to call you to discuss your medication(s)? No   Do you require a signature for your package? (Note: if we are billing Medicare Part B or your order contains a controlled substance, we will require a signature) No         Completed refill call assessment today to schedule patient's medication shipment from the [facility] [phone]).  All relevant notes have been reviewed.       Confirmed patient received a Conservation officer, historic buildings and a Surveyor, mining with first shipment. The patient will receive a drug information handout for each medication shipped and additional FDA Medication Guides as required.         REFERRAL TO PHARMACIST     Referral to the pharmacist: Not needed      [facility]     Shipping address confirmed in Epic.     Delivery Scheduled: Yes, Expected medication delivery date: 9/5.     Medication will be delivered via UPS to the prescription address in Epic WAM.    Alwyn Pea   [facility] Specialty Technician

## 2022-10-04 NOTE — Unmapped (Signed)
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 Her provider plans to add on Stelara - this is currently pending mfg assist.     [facility] Clinical Assessment & Refill Coordination Note    JEFF CHESSOR, DOB: [birth year]  Phone: [phone] (home)     All above HIPAA information was verified with patient.     Was a Nurse, learning disability used for this call? No    Specialty Medication(s):   Inflammatory Disorders: Humira     Current Outpatient Medications   Medication Sig Dispense Refill    clotrimazole-betamethasone (LOTRISONE) 1-0.05 % cream Apply topically two (2) times a day. For up to one week at a time 45 g 1    empty container Misc Use as directed to dispose of Humira syringes. 1 each 2    HUMIRA SYRINGE CITRATE FREE 40 MG/0.4 ML Inject the contents of 2 syringes (80 mg total) under the skin every seven (7) days. 8 each 11    propranolol (INDERAL) 20 MG tablet TAKE 1 TABLET BY MOUTH EVERY DAY       No current facility-administered medications for this visit.        Changes to medications: Ornella reports no changes at this time.    Allergies   Allergen Reactions    Dicyclomine Hcl Nausea Only       Changes to allergies: No    SPECIALTY MEDICATION ADHERENCE     Humira - 1 dose left  Medication Adherence    Patient reported X missed doses in the last month: 0  Specialty Medication: Humira          Specialty medication(s) dose(s) confirmed: Regimen is correct and unchanged.     Are there any concerns with adherence? No    Adherence counseling provided? Not needed    CLINICAL MANAGEMENT AND INTERVENTION      Clinical Benefit Assessment:    Do you feel the medicine is effective or helping your condition? Yes    Clinical Benefit counseling provided? Not needed    Adverse Effects Assessment:    Are you experiencing any side effects? No    Are you experiencing difficulty administering your medicine? No    Quality of Life Assessment:    Quality of Life    Rheumatology  Oncology  Dermatology  1. What impact has your specialty medication had on the symptoms of your skin condition (i.e. itchiness, soreness, stinging)?: Some  2. What impact has your specialty medication had on your comfort level with your skin?: Some  Cystic Fibrosis          How many days over the past month did your HS  keep you from your normal activities? For example, brushing your teeth or getting up in the morning. Patient declined to answer    Have you discussed this with your provider? Not needed    Acute Infection Status:    Acute infections noted within Epic:  No active infections  Patient reported infection: None    Therapy Appropriateness:    Is therapy appropriate based on current medication list, adverse reactions, adherence, clinical benefit and progress toward achieving therapeutic goals? Yes, therapy is appropriate and should be continued     DISEASE/MEDICATION-SPECIFIC INFORMATION      For patients on injectable medications: Patient currently has 1 doses left.  Next injection is scheduled for 9/27.  Chronic Inflammatory Diseases: Have you experienced any flares in the last month? Yes, patient still having flares on therapy - common with Doreene Adas III disease  Has this been reported to your provider? Yes, provider aware - attempting to add on Stelara (pending mfg assist)    PATIENT SPECIFIC NEEDS     Does the patient have any physical, cognitive, or cultural barriers? No    Is the patient high risk? No    Did the patient require a clinical intervention? No    Does the patient require physician intervention or other additional services (i.e., nutrition, smoking cessation, social work)? No    SOCIAL DETERMINANTS OF HEALTH     At the [facility], we have learned that life circumstances - like trouble affording food, housing, utilities, or transportation can affect the health of many of our patients.   That is why we wanted to ask: are you currently experiencing any life circumstances that are negatively impacting your health and/or quality of life? Patient declined to answer    Social Determinants of Health     Financial Resource Strain: Not on file   Internet Connectivity: Not on file   Food Insecurity: Not on file   Tobacco Use: High Risk (08/07/2022)    Patient History     Smoking Tobacco Use: Every Day     Smokeless Tobacco Use: Never     Passive Exposure: Current   Housing/Utilities: Not on file   Alcohol Use: Not on file   Transportation Needs: Not on file   Substance Use: Not on file   Health Literacy: Not on file   Physical Activity: Not on file   Interpersonal Safety: Unknown (10/04/2022)    Interpersonal Safety     Unsafe Where You Currently Live: Not on file     Physically Hurt by Anyone: Not on file     Abused by Anyone: Not on file   Stress: Not on file   Intimate Partner Violence: Not on file   Depression: Not on file   Social Connections: Not on file       Would you be willing to receive help with any of the needs that you have identified today? Not applicable       SHIPPING     Specialty Medication(s) to be Shipped:   Inflammatory Disorders: Humira    Other medication(s) to be shipped: No additional medications requested for fill at this time     Changes to insurance: No    Delivery Scheduled: Yes, Expected medication delivery date: 10/3.     Medication will be delivered via UPS to the confirmed prescription address in [facility].    The patient will receive a drug information handout for each medication shipped and additional FDA Medication Guides as required.  Verified that patient has previously received a Conservation officer, historic buildings and a Surveyor, mining.    The patient or caregiver noted above participated in the development of this care plan and knows that they can request review of or adjustments to the care plan at any time.      All of the patient's questions and concerns have been addressed.    [facility] Specialty Pharmacist

## 2022-10-11 MED FILL — HUMIRA SYRINGE CITRATE FREE 40 MG/0.4 ML: SUBCUTANEOUS | 28 days supply | Qty: 8 | Fill #1

## 2022-11-02 NOTE — Unmapped (Signed)
[facility] Pharm[facility] Refill Coordination Note    Debbie Gregory, DOB: 1990/07/22  Phone: [phone] (home)       All above HIPAA information was verified with patient.         11/02/2022     9:58 AM   Specialty Rx Medication Refill Questionnaire   Which Medications would you like refilled and shipped? Humira   Please list all current allergies: N/a   Have you missed any doses in the last 30 days? No   Have you had any changes to your medication(s) since your last refill? No   How many days remaining of each medication do you have at home? One more injection day   If receiving an injectable medication, next injection date is 11/03/2022   Have you experienced any side effects in the last 30 days? No   Please enter the full address (street address, city, state, zip code) where you would like your medication(s) to be delivered to. [address] [place] [address]   Please specify on which day you would like your medication(s) to arrive. Note: if you need your medication(s) within 3 days, please call the pharmacy to schedule your order at [phone]  11/08/2022   Has your insurance changed since your last refill? No   Would you like a pharmacist to call you to discuss your medication(s)? No   Do you require a signature for your package? (Note: if we are billing Medicare Part B or your order contains a controlled substance, we will require a signature) No         Completed refill call assessment today to schedule patient's medication shipment from the [facility] [phone]).  All relevant notes have been reviewed.       Confirmed patient received a Conservation officer, historic buildings and a Surveyor, mining with first shipment. The patient will receive a drug information handout for each medication shipped and additional FDA Medication Guides as required.         REFERRAL TO PHARMACIST     Referral to the pharmacist: Not needed      [facility]     Shipping address confirmed in Epic.     Delivery Scheduled: Yes, Expected medication delivery date: 10/30.     Medication will be delivered via UPS to the prescription address in Epic WAM.    [name]

## 2022-11-07 MED FILL — HUMIRA SYRINGE CITRATE FREE 40 MG/0.4 ML: SUBCUTANEOUS | 28 days supply | Qty: 8 | Fill #2

## 2022-11-15 DIAGNOSIS — L732 Hidradenitis suppurativa: Principal | ICD-10-CM

## 2022-11-15 MED ORDER — USTEKINUMAB 90 MG/ML SUBCUTANEOUS SYRINGE
SUBCUTANEOUS | 11 refills | 28.00000 days | Status: CP
Start: 2022-11-15 — End: ?

## 2022-11-16 DIAGNOSIS — L732 Hidradenitis suppurativa: Principal | ICD-10-CM

## 2022-11-16 MED ORDER — USTEKINUMAB 90 MG/ML SUBCUTANEOUS SYRINGE
SUBCUTANEOUS | 11 refills | 28.00000 days | Status: CP
Start: 2022-11-16 — End: ?

## 2022-11-28 NOTE — Unmapped (Signed)
[facility] and [facility] Delivery Pharmacy Refill Coordination Note    Debbie Gregory, DOB: [birth year]  Phone: [phone] (home)       All above HIPAA information was verified with patient.         11/27/2022     7:46 PM   Specialty Rx Medication Refill Questionnaire   Which Medications would you like refilled and shipped? Humira   Please list all current allergies: N/a   Have you missed any doses in the last 30 days? No   Have you had any changes to your medication(s) since your last refill? No   How many days remaining of each medication do you have at home? Enough for my next injection day   If receiving an injectable medication, next injection date is 12/01/2022   Have you experienced any side effects in the last 30 days? No   Please enter the full address (street address, city, state, zip code) where you would like your medication(s) to be delivered to. [address] [place] [address]   Please specify on which day you would like your medication(s) to arrive. Note: if you need your medication(s) within 3 days, please call the pharmacy to schedule your order at [phone]  12/04/2022   Has your insurance changed since your last refill? No   Would you like a pharmacist to call you to discuss your medication(s)? No   Do you require a signature for your package? (Note: if we are billing Medicare Part B or your order contains a controlled substance, we will require a signature) No         Completed refill call assessment today to schedule patient's medication shipment from the [facility] [phone]).  All relevant notes have been reviewed.       Confirmed patient received a Conservation officer, historic buildings and a Surveyor, mining with first shipment. The patient will receive a drug information handout for each medication shipped and additional FDA Medication Guides as required.         REFERRAL TO PHARMACIST     Referral to the pharmacist: Not needed      [facility]     Shipping address confirmed in Epic.     Delivery Scheduled: Yes, Expected medication delivery date: 11/26.     Medication will be delivered via UPS to the prescription address in Epic WAM.    Debbie Gregory Specialty and [facility]

## 2022-12-04 MED FILL — HUMIRA SYRINGE CITRATE FREE 40 MG/0.4 ML: SUBCUTANEOUS | 28 days supply | Qty: 8 | Fill #3

## 2023-01-01 NOTE — Unmapped (Signed)
[facility] Home Delivery Pharm[facility] Refill Coordination Note    Specialty Medication(s) to be Shipped:   Inflammatory Disorders: Humira    Other medication(s) to be shipped: No additional medications requested for fill at this time     Debbie Gregory, DOB: 1990/08/01  Phone: [phone] (home)       All above HIPAA information was verified with patient.     Was a Nurse, learning disability used for this call? No    Completed refill call assessment today to schedule patient's medication shipment from the [facility] Delivery Pharm[facility]  [phone]).  All relevant notes have been reviewed.     Specialty medication(s) and dose(s) confirmed: Regimen is correct and unchanged.   Changes to medications: Debbie Gregory reports no changes at this time.  Changes to insurance: No  New side effects reported not previously addressed with a pharmacist or physician: None reported  Questions for the pharmacist: No    Confirmed patient received a Conservation officer, historic buildings and a Surveyor, mining with first shipment. The patient will receive a drug information handout for each medication shipped and additional FDA Medication Guides as required.       DISEASE/MEDICATION-SPECIFIC INFORMATION        For patients on injectable medications: Patient currently has 0 doses left.  Next injection is scheduled for 12/27.    SPECIALTY MEDICATION ADHERENCE     Medication Adherence    Patient reported X missed doses in the last month: 0  Specialty Medication: HUMIRA(CF) 40 mg/0.4 mL injection (adalimumab)              Were doses missed due to medication being on hold? No    Humira 40  mg/0.39ml : 0 doses of medicine on hand       REFERRAL TO PHARMACIST     Referral to the pharmacist: Not needed      [facility]     Shipping address confirmed in Epic.       Delivery Scheduled: Yes, Expected medication delivery date: 12/27.     Medication will be delivered via UPS to the prescription address in Epic WAM.    Debbie Gregory, PharmD   [facility]  Specialty Pharmacist

## 2023-01-04 MED FILL — HUMIRA SYRINGE CITRATE FREE 40 MG/0.4 ML: SUBCUTANEOUS | 28 days supply | Qty: 8 | Fill #4

## 2023-01-29 NOTE — Unmapped (Signed)
[facility] and Home Delivery Pharmacy Refill Coordination Note    Debbie Gregory, DOB: 20-May-1990  Phone: [phone] (home)       All above HIPAA information was verified with patient.         01/28/2023     1:46 PM   Specialty Rx Medication Refill Questionnaire   Which Medications would you like refilled and shipped? Humira   Please list all current allergies: N/a   Have you missed any doses in the last 30 days? No   Have you had any changes to your medication(s) since your last refill? No   How many days remaining of each medication do you have at home? 0   If receiving an injectable medication, next injection date is 02/02/2023   Have you experienced any side effects in the last 30 days? No   Please enter the full address (street address, city, state, zip code) where you would like your medication(s) to be delivered to. [address] [place] [address]   Please specify on which day you would like your medication(s) to arrive. Note: if you need your medication(s) within 3 days, please call the pharmacy to schedule your order at [phone]  02/01/2023   Has your insurance changed since your last refill? No   Would you like a pharmacist to call you to discuss your medication(s)? No   Do you require a signature for your package? (Note: if we are billing Medicare Part B or your order contains a controlled substance, we will require a signature) No         Completed refill call assessment today to schedule patient's medication shipment from the [facility] and [facility] Delivery Pharm[facility] [phone]).  All relevant notes have been reviewed.       Confirmed patient received a Conservation officer, historic buildings and a Surveyor, mining with first shipment. The patient will receive a drug information handout for each medication shipped and additional FDA Medication Guides as required.         REFERRAL TO PHARMACIST     Referral to the pharmacist: Not needed      [facility]     Shipping address confirmed in Epic. Delivery Scheduled: Yes, Expected medication delivery date: 1/23.     Medication will be delivered via UPS to the prescription address in Epic WAM.    [name] and [facility]

## 2023-01-31 MED FILL — HUMIRA SYRINGE CITRATE FREE 40 MG/0.4 ML: SUBCUTANEOUS | 28 days supply | Qty: 8 | Fill #5

## 2023-02-11 NOTE — Unmapped (Addendum)
Meet your team:     Your intake nurse is: Verne Carrow     Please remember to fill out the survey you will receive after your visit. Your comments help [place] continue to improve our care.      Thanks in advance!      [facility] Clinical Staff     For your rash:    Apply fluocinonide 0.05% ointment twice a day to active areas of rash on hands until skin is smooth, then stop. Repeat as needed. Do not use for more than 2 weeks at a time. After applying medication, cover hands with white cotton glove to increase absorption and effectiveness of medication.    For your HS:    Take 10 mg of valium 30 minutes before your surgery.    Inject 80 mg of Humira under the skin every week.     Inject 90 mg of Stelara under the skin every 4 weeks.    You are welcome to join the [facility] for HS of the [employer] group online at [email or web address] or in-person at our regular meetings.  You can textHS to [phone] for meeting reminders or join the group online for regular updates.  This can be a great opportunity to interact and learn from other patients and help work with the HS community.  We hope to see your there!    Hidradentis Suppurativa (pronounced ???high-drad-en-eye-tis/sup-your-uh-tee-vah???) is a chronic disease of hair follicles.  The lesions occur most commonly on areas of skin-to-skin contact: under the arms (axillary area), in the groin, around the buttocks, in the region around the anus and genitals, and on the skin between and under the breasts. In women, the underarms, groin, and breast areas are most commonly affected. Men most often have HS lesions on the buttocks and under the arms and may also have HS at the back of the neck and behind and around the ears.    What does HS look and feel like?   The first thing that someone with HS notices is a tender, raised, red bump that looks like an under-the-skin pimple or boil. Sometimes HS lesions have two or more ???heads.???  In mild disease only an occasional boil or abscess may occur, but in more active disease there can be many new lesions every month.  Some abscesses can become larger and may open and drain pus.  Bleeding and increased odor can also occur. In severe disease, deeper abscesses develop and may connect with each other under the skin to form tunnel-like tracts (sinuses, fistulas).  These may drain constantly, or may temporarily improve and then usually begin draining again over time.  In people who have had sinus tracts for some time, scars form that feel like ropes under the skin. In the very worst cases, networks of sinus tracts can form deeper in the body, including the muscle and other tissues. Many people with severe HS have scars that can limit their ability to freely move their arms or legs, though this is very unlikely for most patients.     Clinicians usually classify or ???grade??? HS using the [facility] staging system according to the severity of the disease for each body location:   [place] stage I: one or more abscesses are present, but no sinus tracts have formed and no scars have developed   Doreene Adas stage II: one or more abscesses are present that resolve and recur; on sinus tract can be present and scarring is seen   [place] stage  III: many abscesses and more than one sinus tract is present with extensive scars.    What causes HS?  The cause of HS is not completely understood.  It seems to be a disorder of hair follicles and often many family members are affected so genetics probably play a strong role.  Bacteria are often present and may make the disease worse, but infection does not seem to be the main cause. Hormones are also likely play a role since the condition typically starts around puberty when hair follicles under the arms and in the groin start to change.  It can sometimes flare with menstrual cycles in women as well.  In most cases it lasts for decades and starts to improve to some extent in the late 30s and 40s as long as many fistulas have not already formed.  Women are three times more likely than men to develop HS.    Other factors are known to contribute to HS flaring or becoming worse, though they are likely not the main causes. The factors most commonly associated with HS include:   Cigarette smoking - Stopping smoking will likely not cure the disease, but likely is helpful in reducing how much and how often it flares and may prevent it from getting as bad over time.   Higher weight - HS may occur even in people that are not overweight, but it is much more common in patients that are.  There is some evidence that losing weight and eating a diet low in sugars and fats may be helpful in improving hidradenitis, though this is not helpful for everyone.  Working with a nutritionist may be an important way to help with this and is something your physician can help coordinate    Hidradenitis is not contagious.  It is not caused by a problem with personal hygiene or any other activity or behavior of those with the disease.    How can your doctor help you treat your hidradenitis?  Clinicians use both medication and surgery to treat HS. The choice of treatment--or combination of treatments--is made according to an individual patient???s needs. Clinicians consider several factors in determining the most appropriate plan for therapy:   Severity of disease - medications and some laser treatments are usually able to control disease best when fistulas are not present.  Fistulas typically require surgery.   Extent and location of disease   Chronicity (how often the lesions recur)    A number of different surgical methods have been developed that are useful for certain patients under particular circumstances. These can be done with local numbing and healing at home for some areas when disease is not too extensive with relatively brief recovery times.  In more extensive disease there may be a need for larger excisions under general anesthesia with healing time in the hospital and prolonged recovery periods for better disease control.      In addition, many medical treatments have been tried--some with more success than others. No medication is effective for all patients, and you and your doctor may have to try several different treatments or combinations of treatments before you find the treatment plan that works best for you.  The goals of therapy with medications that are either topical (used on the skin) or systemic (taken by mouth) are:  1. to clear the lesions or at least reduce their number and extent, and  2. to prevent new lesions from forming.  3. To reduce pain, drainage, and odor  Some of the types of medications commonly used are antibacterial skin washes and the topical antibiotics to prevent secondary infections and corticosteroid injections into the lesions to reduce inflammation.     Other medications that may be used include retinoids (similar to Accutane), drugs that effect how hormones and hair follicles interact, drugs that affect your immune system (such as methotrexate, adalimumab/Humira, and Remicaid/infliximab), steroids, and oral antibiotics.    Lasers that destroy hair follicles can also be helpful since they reduce the hair follicles that cause the problems.  Multiple treatments are typically required over time and there is some discomfort associated with treatment, but it is typically very fast and well-tolerated.    It is very important to realize that hidradenitis cannot usually be completely cured with any single medication or surgical procedure.  It is a disease that can be very stubborn and difficult to control, but with good treatment a lot of improvement and sometimes temporary remissions can be obtained. Poorly controlled disease can cause more fistulas to form and make managing the disease much more difficult over time so it is important to seek care to reduce major flares.  Surgery can provide a long term cure in some areas, though the disease can start again or continue in nearby areas.  A dermatologist is often the best person to help coordinate disease treatment, and sometimes other surgeons, pain specialists, other specialists, and nutritionists may be part of the treatment team.    For severe disease, the first goal is often to reduce pain and symptoms with medicines so that the disease feels more stable. Once it's stable, we often start thinking about how to address areas that have completely gotten better with surgery if they are still causing problems.    What can you do to help your HS?  1. Stopping smoking is hard and may not fix everything, but it may be a step in the right direction.  We or your primary care physician can provide resources to help stop if you are interested.  2. Follow a healthy diet and try to achieve a healthy weight.  Some other self-help measures are:   Keep your skin cool and dry (becoming overheated and sweating can contribute to an HS flare)   To reduce the pain of cysts or nodules or to help them to drain, apply hot compresses or soak in hot water for 10 minutes at a time (use a clean washcloth or a teabag soaked in hot water)   For female patients, cotton underwear that does not have tight elastic in the groin can be helpful.  Boyshort, brief, or boxer style underwear may be a better option as friction on hair follicles in affected areas can be a major trigger in some patients.  These can be easily found on [place] or with some retailers.  Fruit of the Loom and Underworks are two brands that are sometimes recommended.    Finally, know that you are not alone. Coping with the pain and other symptoms of HS can be very difficult, so it may be helpful to connect with others who live with HS. Patient groups and networks can be sources of important information and support. Some internet resources for information and connections are provided below.    Psychologytoday.com is a resource to find psychologists and therapists that can help support you in your are     [email or web address] can help connect with sexual health resources and counselors    Resources for Information  The Hidradenitis Suppurativa Foundation: A nonprofit organized by a group of physicians interested in treating and advancing research in hidradenitis suppurativa.  This group advocates for better care and research for hidradenitis and has educational materials put together specifically for patients that have been reviewed and produced by doctors and people with hidradenitis.    American Academy of Dermatology  ARanked.fi    [employer]  ElevatorPitchers.de.html  [employer]: [employer]  https://www.rarediseases.org/rare-disease-information/rare-diseases/byID/358/viewAbstract  Trials of new medications for HS  Https://www.clinicaltrials.gov

## 2023-02-11 NOTE — Unmapped (Signed)
Dermatology Note    Assessment and Plan:      Hidradenitis Suppurativa, Hurley Stage 2, chronic, flaring, not at treatment goal  - Previous therapies: Brodalumab, Doxycycline,Minocycline, Clindamycin, Humira, Spironolactone, Cosentyx, Remicade, Simponi, Siliq  - We discussed the typical natural history, pathogenesis, treatment options, and expected course as well as the relapsing and sometimes recalcitrant nature of the disease.   - Once again discussed surgical treatment/deroofings of axillae. Patient elected to proceed with surgery for L axilla prior to R axilla.  - Based on discussion above and R/B/A reviewed, mutual decision to proceed with the following:   - Continue Humira 80 mg weekly   - Continue Stelara 90 mg q4 weeks    - Discussed risks of tuberculosis, other uncommon infections, theoretical risk of malignancies, and other uncommon side effects.   - Start valium 10 mg 30 minutes prior to surgery  - Will message Rosalita Chessman to schedule surgery. Expected areas would be about 4x2 cm of the L axilla.     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   - Start fluocinonide 0.05% ointment; Apply BID to affected areas on hands under white cotton glove occlusion until skin is smooth.  - We reviewed proper use and side effects of topical steroids including striae and cutaneous atrophy.   - Continue OTC hand creams and cuticle oil    The patient was advised to call for an appointment should any new, changing, or symptomatic lesions develop.     RTC: Return for next available for L axilla HS surgery. or sooner as needed   _________________________________________________________________      Chief Complaint     Chief Complaint   Patient presents with    Hidradenitis suppurativa     Pt coming in for HS follow up.   Current flares both underarms, left breast, right inguinal fold.        HPI     Debbie Gregory is a 33 y.o. female who presents as a returning patient (last seen by Dr. Katherina Right and Dr. Janyth Contes on 08/07/2022) to Dermatology for follow up of hidradenitis suppurativa. At last visit, patient was to continue humira 80 mg weekly, start stelara 90 mg every 4 weeks, and start augmentin 875-125 mg twice daily for 4 weeks for her HS.     Patient reports stability of her HS since last visit. Endorses flares on her underarms, chest, and groin today. Endorses tenderness and drainage. Currently administering humira every week and stelara every 4 weeks with slight relief.     Notes that the inflamed rash around her fingernails has been present since she was a child. Reports subconsciously picking at the skin around her fingernails and hands when she is anxious. She currently uses cuticle oil and hand creams with slight relief. Also wears gloves frequently to prevent picking.       Self-reported severity (0-5): 2  VAS pain today: 2  VAS average pain for the last month: 5  Requiring pain medication? Yes.  If so, what type/frequency? tylenol  How often in pain?  few times a week  Level of odor (0-5): 2  Level of itching (0-5): 4  Dressing changes needed for drainage:Twice  a day  How much drainage: some drainage  Flare in the last month (Y/N)? No.  How long ago was the last flare? in last 6 months  Developing new lesions? less than monthly  Number of inflammatory lesions montly: 3-5  DLQI: 19  Current treatment: humira and stelara     How helpful is the current treatment in managing the following aspects of your disease?  Not at all helpful Somewhat helpful Very helpful   Pain   x     Decreasing length of flares     x   Decreasing new lesions   x     Drainage   x     Decreasing frequency of flares   x     Decreasing severity of flares     x Odor   x             The patient denies any other new or changing lesions or areas of concern.     Pertinent Past Medical History     No history of skin cancer     Prior HS treatments:  Topical: Did not list which ones  Systemic: brodalumab, Doxycycline,Minocycline, Clindamycin, Humira, Spironolactone, Cosentyx, Remicade, Simponi, Siliq  Past surgical procedures: None  Past laser procedures: None    Family History:   Negative for melanoma    Social History:  Current or former smoker? current  Amount smoking: less than half ppd  How many years: Not answered  ED visits in the last 5 years? never  Difficulty affording medications? never  Marital Status: single  Living with some one? Yes.    Past Medical History, Family History, Social History, Medication List, Allergies, and Problem List were reviewed in the rooming section of Epic.     ROS: Other than symptoms mentioned in the HPI, no fevers, chills, or other skin complaints    Physical Examination     Gen: Well-appearing patient, appropriate, interactive, in no acute distress  SKIN (Focal Skin Exam): Per patient request, examination of bilateral axillae, chest, abdomen, bilateral thighs, groin, buttocks, external genitalia, and hands was performed  - distal fingertips past the IPJ on most fingers has erythema and mild scaling encompassing entire fingertip and nail folds  - few nails have atypical ridging patterns    location Abscess Inflamed nodule Non-inflamed nodule Draining sinus Non-draining Sinus Hurley % scar   R axilla  2  1 1      L axilla  1  1      R inframammary  2  1      L inframammary  2   1     Intermammary          Pubic          R inguinal  1        R thigh          L inguinal  1   1     L thigh          Scrotum/Vulva          Perianal          R buttock          L buttock          Other (list)                      AN count (total sum of abscess and inflammatory nodule): 9    -sites not commented on demonstrate normal  findings.    Scribe's Attestation: Elsie Stain, MD obtained and performed the history, physical exam and medical decision making elements that were entered into the chart.  Signed by Donnella Sham, Scribe, on February 12, 2023 at 2:31 PM.    ----------------------------------------------------------------------------------------------------------------------  February 12, 2023 5:26 PM. Documentation assistance provided by the Scribe. I was present during the time the encounter was recorded. The information recorded by the Scribe was done at my direction and has been reviewed and validated by me.  ----------------------------------------------------------------------------------------------------------------------       (Approved Template 09/22/2019)

## 2023-02-12 ENCOUNTER — Ambulatory Visit: Admit: 2023-02-12 | Discharge: 2023-02-13 | Payer: PRIVATE HEALTH INSURANCE

## 2023-02-12 DIAGNOSIS — L732 Hidradenitis suppurativa: Principal | ICD-10-CM

## 2023-02-12 MED ORDER — FLUOCINONIDE 0.05 % TOPICAL OINTMENT
Freq: Two times a day (BID) | TOPICAL | 5 refills | 0.00 days | Status: CP
Start: 2023-02-12 — End: ?

## 2023-02-12 MED ORDER — DIAZEPAM 5 MG TABLET
ORAL_TABLET | 0 refills | 0.00 days | Status: CP
Start: 2023-02-12 — End: ?

## 2023-02-12 NOTE — Unmapped (Deleted)
Self-reported severity (0-5): 2  VAS pain today: 2  VAS average pain for the last month: 5  Requiring pain medication? Yes.  If so, what type/frequency? tylenol  How often in pain?  few times a week  Level of odor (0-5): 2  Level of itching (0-5): 4  Dressing changes needed for drainage:Twice a day  How much drainage: some drainage  Flare in the last month (Y/N)? No.  How long ago was the last flare? {last flare:53297}  Developing new lesions? less than monthly  Number of inflammatory lesions montly: 3-5  DLQI: 19  Current treatment: ***     How helpful is the current treatment in managing the following aspects of your disease?  Not at all helpful Somewhat helpful Very helpful   Pain  x    Decreasing length of flares   x   Decreasing new lesions  x    Drainage  x    Decreasing frequency of flares  x    Decreasing severity of flares   x   Odor  x

## 2023-02-26 NOTE — Unmapped (Signed)
 [facility] Refill Coordination Note    Specialty Medication(s) to be Shipped:   Inflammatory Disorders: Humira    Other medication(s) to be shipped: No additional medications requested for fill at this time     Debbie Gregory, DOB: [birth year]  Phone: [phone] (home)       All above HIPAA information was verified with patient.     Was a Nurse, learning disability used for this call? No    Completed refill call assessment today to schedule patient's medication shipment from the [facility]  [phone]).  All relevant notes have been reviewed.     Specialty medication(s) and dose(s) confirmed: Regimen is correct and unchanged.   Changes to medications: Debbie Gregory reports no changes at this time.  Changes to insurance: No  New side effects reported not previously addressed with a pharmacist or physician: None reported  Questions for the pharmacist: No    Confirmed patient received a Conservation officer, historic buildings and a Surveyor, mining with first shipment. The patient will receive a drug information handout for each medication shipped and additional FDA Medication Guides as required.       DISEASE/MEDICATION-SPECIFIC INFORMATION        For patients on injectable medications: Patient currently has 0 doses left.  Next injection is scheduled for 2/21.    SPECIALTY MEDICATION ADHERENCE     Medication Adherence    Patient reported X missed doses in the last month: 0  Specialty Medication: HUMIRA SYRINGE CITRATE FREE 40 MG/0.4 ML  Patient is on additional specialty medications: No  Informant: patient              Were doses missed due to medication being on hold? No    Humira 40  mg/0.35ml : 0 doses of medicine on hand       REFERRAL TO PHARMACIST     Referral to the pharmacist: Not needed      [facility]     Shipping address confirmed in Epic.       Delivery Scheduled: Yes, Expected medication delivery date: 2/21.     Medication will be delivered via UPS to the prescription address in Epic WAM.    Debbie Gregory Specialty and [facility]

## 2023-03-01 NOTE — Unmapped (Signed)
 Arlys John 's HUMIRA(CF) 40 mg/0.4 mL injection (adalimumab) shipment will be delayed as a result of inclement weather.     I have reached out to the patient  at [phone]  and left a voicemail message.  We will wait for a call back from the patient to reschedule the delivery.  We have not confirmed the new delivery date.

## 2023-03-01 NOTE — Unmapped (Signed)
 Arlys John 's HUMIRA(CF) 40 mg/0.4 mL injection (adalimumab) shipment will be sent out as a result of inclement weather.     I have spoken with the patient  via incoming phone call and communicated the delivery change. We will reschedule the medication for the delivery date that the patient agreed upon.  We have confirmed the delivery date as 03/06/23, via ups.

## 2023-03-05 MED FILL — HUMIRA SYRINGE CITRATE FREE 40 MG/0.4 ML: SUBCUTANEOUS | 28 days supply | Qty: 8 | Fill #6

## 2023-03-30 DIAGNOSIS — L732 Hidradenitis suppurativa: Principal | ICD-10-CM

## 2023-03-30 NOTE — Unmapped (Signed)
 [facility] Refill Coordination Note    Specialty Medication(s) to be Shipped:   Inflammatory Disorders: Humira    Other medication(s) to be shipped: No additional medications requested for fill at this time     Debbie Gregory, DOB: [birth year]  Phone: [phone] (home)       All above HIPAA information was verified with patient.     Was a Nurse, learning disability used for this call? No    Completed refill call assessment today to schedule patient's medication shipment from the [facility]  [phone]).  All relevant notes have been reviewed.     Specialty medication(s) and dose(s) confirmed: Regimen is correct and unchanged.   Changes to medications: Debbie Gregory reports no changes at this time.  Changes to insurance: No  New side effects reported not previously addressed with a pharmacist or physician: None reported  Questions for the pharmacist: No    Confirmed patient received a Conservation officer, historic buildings and a Surveyor, mining with first shipment. The patient will receive a drug information handout for each medication shipped and additional FDA Medication Guides as required.       DISEASE/MEDICATION-SPECIFIC INFORMATION        For patients on injectable medications: Patient currently has 0 doses left.  Next injection is scheduled for 3/21.    SPECIALTY MEDICATION ADHERENCE     Medication Adherence    Patient reported X missed doses in the last month: 1  Specialty Medication: HUMIRA SYRINGE CITRATE FREE 40 MG/0.4 ML  Patient is on additional specialty medications: No  Informant: patient   Other non-adherence reason: Patient took two doses week of 2/24 per Provider's instructions.                 Were doses missed due to medication being on hold? No    HUMIRA SYRINGE CITRATE FREE 40 MG/0.4 ML: 0 doses of medicine on hand       REFERRAL TO PHARMACIST     Referral to the pharmacist: Not needed      SHIPPING     Shipping address confirmed in Epic.     Cost and Payment: Unable to determine copay at this time as the prescription requires a prior authorization/financial assistance. Patient is aware that shipment will be held until copay has been approved and payment information collected, if needed.    Delivery Scheduled: Yes, Expected medication delivery date: 3/26 pending PA Approval.     Medication will be delivered via UPS to the prescription address in Epic WAM.    [name]  Specialty Technician

## 2023-04-03 NOTE — Unmapped (Signed)
 Debbie Gregory 's HUMIRA(CF) 40 mg/0.4 mL injection (adalimumab) shipment will be canceled as a result of prior authorization being required by the patient's insurance. (PA denied)    I have spoken with the patient  at [phone]  and communicated the delay. We will not reschedule the medication and have removed this/these medication(s) from the work request.  We have canceled this work request.

## 2023-04-22 MED ORDER — PREDNISONE 10 MG TABLET
ORAL_TABLET | 0 refills | 0.00 days | Status: CP
Start: 2023-04-22 — End: ?

## 2023-05-07 ENCOUNTER — Ambulatory Visit: Admit: 2023-05-07 | Discharge: 2023-05-08 | Payer: Medicaid (Managed Care)

## 2023-05-07 MED ORDER — HYDROCODONE 5 MG-ACETAMINOPHEN 325 MG TABLET
ORAL_TABLET | ORAL | 0 refills | 0.00000 days | Status: CP
Start: 2023-05-07 — End: ?

## 2023-05-07 NOTE — Unmapped (Addendum)
 Dermatology Note    Assessment and Plan:      Hidradenitis Suppurativa, Hurley Stage 2, chronic, flaring, not at treatment goal  - Previous therapies: Brodalumab, Doxycycline,Minocycline, Clindamycin, Humira, Spironolactone, Cosentyx, Remicade, Simponi, Siliq  - We discussed the typical natural history, pathogenesis, treatment options, and expected course as well as the relapsing and sometimes recalcitrant nature of the disease.   - Once again discussed surgical treatment/deroofings of axillae. Patient elected to proceed with surgery for L axilla prior to R axilla.  - Based on discussion above and R/B/A reviewed, mutual decision to proceed with the following:   - Continue Humira 80 mg weekly, currently waiting on free drug application completion/submission  - Continue Stelara 90 mg q4 weeks    - Discussed risks of tuberculosis, other uncommon infections, theoretical risk of malignancies, and other uncommon side effects.   - valium 10 mg 30 minutes prior to surgery  - Deroofing today as below     High risk medication use (Humira and Stelara)  - CMP, Lipid panel, CBC w/ diff, Hep B&C serologies WNL on 05/02/21   - Quant gold negative on 08/07/2022    Irritant dermatitis on the fingers  - Diagnosis, treatment options, prognosis, risk/ benefit, and side effects of treatment were discussed with the patient.   - continue fluocinonide 0.05% ointment; Apply BID to affected areas on hands under white cotton glove occlusion until skin is smooth.  - Continue OTC hand creams and cuticle oil    The patient was advised to call for an appointment should any new, changing, or symptomatic lesions develop.     Unroofing procedure with excision for hidradenitis in location L axilla:  Risk, benefits, and alternatives were discussed.  Following alcohol prep, anesthesia with 0.5% lidocaine with epinephrine was first performed with a total of 25ml, and then fistula probe and/or forceps was used to delineate the involved area.  Iris scissor was used to open all detectable sinuses. Complex sequential unroofing and wound contouring with beveling was performed with scissor or blade as appropriate to assure optimal tissue sparing and wound healing.  Hemostasis was obtained in the usual fashion with pressure, AlCl solution, and/or cautery.  Area was dressed with petrolatum and bandage.  Wound care instructions given. We will contact the patient with results when available.  Time spent in procedure: 45 minutes. Size of wound 30 x 15 mm.    Specimen type: specimen type HS: deroofing  Lesion biopsy site (affected, unaffected): affected  Anatomic Site: L axilla  Hurley Stage (I, II, III): II  Approximated area of involvement in region (cm2): 4.5    Lesion Morphology, presence of:   Inflammatory papule count: 0  Noninflammatory papule count: 0  Pustule count: 0  Inflammatory nodule count:1  Noninflammatory nodule count: 1  Eroded nodule count: 0  Inflammatory plaque count: 0  Noninflammatory plaque count: 0  Abscess count: 0  Comedones count: 2  Double comedones:count: 1  Draining tunnel count: 0  Nondraining tunnel count: 2  Fistula count: 0  Hypertrophic scar count: 0  Keloid scar count: 0  Atrophic scar count: 0  Honeycomb/cribriform scar count: 0  Gelatinous material observed: yes    Submitted tissue includes: Inflammatory nodule and Non-draining tunnel        RTC: 3-4 months  _________________________________________________________________      Chief Complaint     No chief complaint on file.      HPI     Debbie Gregory is a 33 y.o. female  who presents as a returning patient (last seen by Dr. Katie Parks and Dr. Jann Melody on 02/12/2023) to Dermatology for follow up of hidradenitis suppurativa. At last visit, patient was to continue humira 80 mg weekly, start stelara 90 mg every 4 weeks, and start augmentin 875-125 mg twice daily for 4 weeks for her HS. Ended up missing Humira doses and flaring due to insurance denial and we are working on the free drug program currently. Started prednisone a couple of weeks ago and it is calming things down pretty well.  Feels like combination of Stelara and Humira are better than all the past monotherapies she has done.  The patient denies any other new or changing lesions or areas of concern.     Pertinent Past Medical History     No history of skin cancer     Prior HS treatments:  Topical: Did not list which ones  Systemic: brodalumab, Doxycycline,Minocycline, Clindamycin, Humira, Spironolactone, Cosentyx, Remicade, Simponi, Siliq  Past surgical procedures: None  Past laser procedures: None    Family History:   Negative for melanoma    Social History:  Current or former smoker? current  Amount smoking: less than half ppd  How many years: Not answered  ED visits in the last 5 years? never  Difficulty affording medications? never  Marital Status: single  Living with some one? Yes.    Past Medical History, Family History, Social History, Medication List, Allergies, and Problem List were reviewed in the rooming section of Epic.     ROS: Other than symptoms mentioned in the HPI, no fevers, chills, or other skin complaints    Physical Examination     Focused exam in L axilla 2 NDS, 1 IN

## 2023-05-08 NOTE — Unmapped (Signed)
 PA initiated for hydrocodone-acetaminophen 5-325 mg tablet via Epic.  Information located under the referral tab.

## 2023-05-09 NOTE — Unmapped (Signed)
 PA request for Humira was denied. Must not be taking another injectable biologic(Why) This decision was the  appeal. Letter will be scanned into the chart under the Media Tab. Patient will be notified by Mychart or phone call.

## 2023-05-11 NOTE — Unmapped (Signed)
 A patient assistance renewal for was scanned into the patients chart to be completed by the provider.

## 2023-05-11 NOTE — Unmapped (Signed)
 PA Approved for HYDROcodone-acetaminophen (NORCO) 5-325 mg per tablet

## 2023-05-22 NOTE — Unmapped (Signed)
 MAP process being managed by [facility] authorization team.

## 2023-06-08 NOTE — Unmapped (Signed)
 Specialty Medication(s): Humira    [name] has been dis-enrolled from the [facility] specialty pharmacy services as a result of enrollment in a manufacturer assistance program that sends medicine directly to the patient.    Additional information provided to the patient: na    Debbie Gregory [facility] Specialty Pharmacist

## 2023-08-06 ENCOUNTER — Ambulatory Visit: Admitting: Physical Therapy

## 2023-08-06 DIAGNOSIS — M25561 Pain in right knee: Secondary | ICD-10-CM | POA: Insufficient documentation

## 2023-08-27 DIAGNOSIS — S93492A Sprain of other ligament of left ankle, initial encounter: Secondary | ICD-10-CM | POA: Insufficient documentation

## 2023-08-27 DIAGNOSIS — S92423A Displaced fracture of distal phalanx of unspecified great toe, initial encounter for closed fracture: Secondary | ICD-10-CM | POA: Insufficient documentation

## 2023-08-27 DIAGNOSIS — S93491A Sprain of other ligament of right ankle, initial encounter: Secondary | ICD-10-CM | POA: Insufficient documentation

## 2023-09-24 DIAGNOSIS — L732 Hidradenitis suppurativa: Principal | ICD-10-CM

## 2023-09-24 MED ORDER — RINVOQ 15 MG TABLET,EXTENDED RELEASE
Freq: Every day | ORAL | 0.00000 days | Status: CN
Start: 2023-09-24 — End: ?

## 2023-09-24 MED ORDER — BIMEKIZUMAB-BKZX 320 MG/2 ML SUBCUTANEOUS SYRINGE
SUBCUTANEOUS | 11 refills | 0.00000 days | Status: CP
Start: 2023-09-24 — End: ?

## 2023-09-26 DIAGNOSIS — L732 Hidradenitis suppurativa: Principal | ICD-10-CM

## 2023-10-04 MED ORDER — SPIRONOLACTONE 100 MG TABLET
ORAL_TABLET | Freq: Every day | ORAL | 3 refills | 90.00000 days | Status: CP
Start: 2023-10-04 — End: 2024-10-03

## 2023-10-04 MED ORDER — CLINDAMYCIN PHOSPHATE 1 % TOPICAL SWAB
11 refills | 0.00000 days | Status: CP
Start: 2023-10-04 — End: ?

## 2023-10-04 MED ORDER — TRETINOIN 0.025 % TOPICAL CREAM
TOPICAL | 11 refills | 0.00000 days | Status: CP
Start: 2023-10-04 — End: ?

## 2023-10-05 MED ORDER — BIMEKIZUMAB-BKZX 320 MG/2 ML SUBCUTANEOUS SYRINGE
SUBCUTANEOUS | 4 refills | 0.00000 days | Status: CP
Start: 2023-10-05 — End: ?
  Filled 2023-10-15: qty 4, 28d supply, fill #0

## 2023-10-08 ENCOUNTER — Ambulatory Visit: Attending: Student

## 2023-10-08 ENCOUNTER — Other Ambulatory Visit: Payer: Self-pay

## 2023-10-08 DIAGNOSIS — M25572 Pain in left ankle and joints of left foot: Secondary | ICD-10-CM | POA: Diagnosis present

## 2023-10-08 DIAGNOSIS — M25571 Pain in right ankle and joints of right foot: Secondary | ICD-10-CM | POA: Insufficient documentation

## 2023-10-08 DIAGNOSIS — M25552 Pain in left hip: Secondary | ICD-10-CM | POA: Insufficient documentation

## 2023-10-08 DIAGNOSIS — R262 Difficulty in walking, not elsewhere classified: Secondary | ICD-10-CM | POA: Insufficient documentation

## 2023-10-08 NOTE — Therapy (Unsigned)
 OUTPATIENT PHYSICAL THERAPY LOWER EXTREMITY EVALUATION   Patient Name: Angela Hawkins MRN: [id number] DOB:[birth year], 33 y.o., female Today's Date: 10/09/2023  END OF SESSION:  PT End of Session - 10/08/23 1132     Visit Number 1    Date for Recertification  12/03/23    PT Start Time 1015    PT Stop Time 1100    PT Time Calculation (min) 45 min    Activity Tolerance Patient tolerated treatment well    Behavior During Therapy [facility] for tasks assessed/performed          History reviewed. No pertinent past medical history. Past Surgical History:  Procedure Laterality Date   BREAST ENHANCEMENT SURGERY     There are no active problems to display for this patient.   PCP: Alvera Reagin, PA  REFERRING PROVIDER: Aniceto Eva Grebe, PA-C ( B ankle sprains), Redmon, Noelle, PA (L hip)  REFERRING DIAG: B ankle sprains, R hallux fracture, L lateral hip pain  THERAPY DIAG:  Pain in right ankle and joints of right foot  Pain in left ankle and joints of left foot  Pain in left hip  Difficulty in walking, not elsewhere classified  Rationale for Evaluation and Treatment: Rehabilitation  ONSET DATE: L hip April 2025, B ankles July 2025  SUBJECTIVE:   SUBJECTIVE STATEMENT: Out of B ankle braces for one 1/2 weeks, boot R ankle and L ankle brace for about 6 weeks total.  L hip pain was present prior to B ankle pain  PERTINENT HISTORY: Clemens off neighbors steps when taking  PAIN:  Are you having pain? Yes: NPRS scale: 0 to 4 Pain location: B lat ankles, L lat/ ant hip Pain description: ankles deep, more off balance and unstable, L hip sharp Aggravating factors: L hip liting L leg on and off bed, B ankles laterally Relieving factors: sits  PRECAUTIONS: None  RED FLAGS: None   WEIGHT BEARING RESTRICTIONS: No  FALLS:  Has patient fallen in last 6 months? Yes. Number of falls 1  LIVING ENVIRONMENT: Lives with: lives with their family Lives in: House/apartment Stairs:  outdoors, 3 -4  Has following equipment at home: None  OCCUPATION: at home, has 62 y o   PLOF: Independent  PATIENT GOALS: be better balanced and stable   NEXT MD VISIT: Nov 2025  OBJECTIVE:  Note: Objective measures were completed at Evaluation unless otherwise noted.  DIAGNOSTIC FINDINGS: R distal hallux fx healed  PATIENT SURVEYS:  Lower Extremity Functional Score: 48 / 80 = 60.0 %  COGNITION: Overall cognitive status: Within functional limits for tasks assessed     SENSATION: WFL   POSTURE: with sit to stand note overpronation L ankle and L knee In standing B navicular heigh  PALPATION: L prirformis, post L hip tender . Non tender L TFL  LOWER EXTREMITY ROM:all ROM B ankles wnl AROM all planes L hip IR sluggish and sore, lacking 10 degrees as compared to R   LOWER EXTREMITY MMT:  MMT Right eval Left eval  Hip flexion    Hip extension lock bridge unilateral  4 4  Hip abduction    Hip adduction    Hip internal rotation    Hip external rotation  3+  Knee flexion    Knee extension    Ankle dorsiflexion    Ankle plantarflexion B heel raises standing  3+, poor eccentric control 3+poor eccentric conrol  Ankle inversion    Ankle eversion 3+ 3+   (Blank rows = not tested)  FUNCTIONAL TESTS:  30 seconds chair stand test no UE support:  16 reps noted increased overpronation L ankle and L knee with this motion  GAIT: Distance walked: in clinic 70 to 90' Assistive device utilized: None Level of assistance: Modified independence Comments: antalgic on L, shortened stance time on L  noted, slower cadence noted B                                                                                                                                TREATMENT DATE: 10/08/23:  Evaluation, education in the exercises as indicated below, also education throughout session regarding eval findings, treatment plan, expected outcomes  PATIENT EDUCATION:  Education details: POC<  goals Person educated: Patient Education method: Explanation, Demonstration, Tactile cues, Verbal cues, and Handouts Education comprehension: verbalized understanding  HOME EXERCISE PROGRAM: Access Code: [email or web address] URL: [email or web address] Date: 10/08/2023 Prepared by: Erienne Spelman  Exercises - Seated Hip Abduction  - 1 x daily - 7 x weekly - 3 sets - 10 reps - Ankle and Toe Plantarflexion with Resistance  - 1 x daily - 7 x weekly - 3 sets - 10 reps - Seated Ankle Eversion with Resistance  - 1 x daily - 7 x weekly - 3 sets - 10 reps  ASSESSMENT:  CLINICAL IMPRESSION: Patient is a 33 y.o. female who was evaluated  today for physical therapy evaluation and treatment for B ankle sprains as the result of a fall in July, as well as L lateral hip pain which has been occurring since April for which she had an ongoing  referral from her PCP. She presents with weakness B ankles and decreased balance , stability, also L hip external rotation weakness and pt tenderness over piriformis, post hip musculature.  She should benefit from a progressive strengthening and balance regimen to address recovery of her B ankle function as well as manual techniques and progressive hip stability training as well.   OBJECTIVE IMPAIRMENTS: decreased activity tolerance, decreased balance, decreased knowledge of condition, decreased mobility, difficulty walking, decreased strength, increased fascial restrictions, impaired perceived functional ability, improper body mechanics, postural dysfunction, and pain.   ACTIVITY LIMITATIONS: carrying, lifting, standing, squatting, sleeping, stairs, transfers, bed mobility, and locomotion level  PARTICIPATION LIMITATIONS: cleaning, laundry, community activity, and yard work  PERSONAL FACTORS: Fitness, Past/current experiences, Time since onset of injury/illness/exacerbation, and 1-2 comorbidities: chronic L lat hip pain,  are also affecting patient's functional outcome.    REHAB POTENTIAL: Good  CLINICAL DECISION MAKING: Evolving/moderate complexity  EVALUATION COMPLEXITY: Moderate   GOALS: Goals reviewed with patient? Yes  SHORT TERM GOALS: Target date: 2 weeks, 10/22/23 I HEP Baseline:initiated at eval Goal status: INITIAL   LONG TERM GOALS: Target date: 8 weeks, 12/03/23  Lower Extremity Functional Score: 48 / 80 = 60.0 % Baseline:  Goal status: INITIAL  2.  30 sec sit to stand improve from 16 to 20 or greater with symmetrical movement B LE's Baseline:  Goal status: INITIAL  3.  Patient to maintain 30 sec unilateral standing on each foot which is normal  Baseline: unable Goal status: INITIAL  4.  B ankle strength and L hip ER strength improve to wnl Baseline: see chart above Goal status: INITIAL  5.  Gait on even, uneven terrain, with equal stride length B, no antalgia or balance loss , over 800' with speed 1.0 or greater Baseline: antalgic , slow gait Goal status: INITIAL    PLAN:  PT FREQUENCY: 1x/week  PT DURATION: 8 weeks  PLANNED INTERVENTIONS: 97110-Therapeutic exercises, 97530- Therapeutic activity, [id number]- Neuromuscular re-education, 97535- Self Care, [id number]- Manual therapy, 97033- Ionotophoresis 4mg /ml Dexamethasone, Patient/Family education, Cryotherapy, and Moist heat  PLAN FOR NEXT SESSION: progress with additional ankle strength and stability training, manual techniques to reduce muscular restrictions L lat/post hip, progress with B LE strength   Elisa Sorlie L Fallan Mccarey, PT, DPT, OCS 10/09/2023, 8:22 AM

## 2023-10-09 ENCOUNTER — Ambulatory Visit
Admit: 2023-10-09 | Discharge: 2023-10-09 | Payer: Medicaid (Managed Care) | Attending: Radiation Oncology | Primary: Radiation Oncology

## 2023-10-09 DIAGNOSIS — L732 Hidradenitis suppurativa: Principal | ICD-10-CM

## 2023-10-09 NOTE — Unmapped (Signed)
 RADIATION ONCOLOGY INITIAL CONSULTATION NOTE    Encounter Date: 10/09/2023  Patient Name: Debbie Gregory  Medical Record Number: [id number]    Referring Physician: Paulita Lonni Rush, MD  [address]  [address]  [place],  [place] [address]  .  Primary Care Provider: Redmon, Noelle P, PA    ASSESSMENT:  32yo with hidradenitis suppurativa with symptomatic disease in the right axilla and left groin refractory to prior therapies    GOAL OF TREATMENT:  Symptomatic relief    RECOMMENDATIONS:  CANCER TREATMENT:  We reviewed her clinical course to date and I have separately reviewed her case with dermatology.  She has previously been treated with multiple systemic and local therapies and continues to have symptomatic lesions (right axilla, left groin).  We reviewed that radiation is not traditionally used as a therapy in HS, but there have been case reports and small series demonstrating partial/complete responses to low dose radiation Adolphus, Dermatology 2021).  Given her mixed results with standard therapies it is certainly reasonable to consider RT.    We reviewed that there is no set regimen in the literature for treatment.  Most studies use 4-20Gy in <5 fractions, with a number of studies showing good evidence with <=10Gy over a few fractions.  We discussed that some responses are either partial or temporary and some of the series do utilize a second course of radiation to improve response.  We reviewed the potential complications of treatment including fatigue, skin irritation, hair loss in the irradiated area, soft tissue fibrosis, secondary malignancy.  We specifically discussed that the axillary lesion is adjacent to breast tissue and that radiation could increase her lifetime risk of breast cancer.    She understands the clinical situation, limited data to support RT, and potential side effects and would like to move ahead with radiation.  We reviewed all of her symptomatic lesions and elected to proceed with treatment of the right axilla and left groin.  She is amenable to this approach     RADIATION PLANNING:  She was in agreement with our plan.  A CT simulation will be performed in our clinic to begin the planning process for the patient's radiation therapy.   We plan to treat the right axilla and left groin to a dose of 7.5 Gy in 3 fractions.  Tentative start date will be 2 weeks after CT simulation    INFORMED CONSENT:    We discussed the risks, benefits, side effects, and alternative treatments. The possibility of severe/permenant radiation damage to normal tissues within the radiated area were discussed.  Signed, witnessed consent was obtained.    REASON FOR CONSULTATION:   Debbie Gregory is a 33 y.o. female who is seen in consultation at the request of Paulita Lonni Rush* for an opinion regarding the use of radiation therapy in the treatment of her Hidradenitis suppurativa    DIAGNOSIS:  Debbie Gregory Stage II Hidradenitis Suppurativa      HISTORY OF PRESENT ILLNESS:  Information pertinent to today's evaluation are the following:    [name] has a history of hidradenitis suppurativa dating first diagnosed when she was ~17.  This was initially managed conservatively with topical medications but she subsequently progressed necessitating systemic therapies (see detailed list of prior treatments below).  In addition, she had a surgical deroofing of left axillary lesions earlier this year that did help with symptoms in that area but she had a difficult time with the surgery/recovery and would like to avoid that  again if possible.    Currently she has symptomatic areas in the right axilla, left groin, and bilateral breasts/inframammary folds.  The right axillary area is her most bothersome area as she has pain (~5-6/10) and drainage.  The left groin can be more painful (8-9/10 intermittently) and was draining much more a few weeks ago but seems to have settled down some.  The areas on her breast are not as bothersome at this time.  At this point she is taking humira  and Stelara .  She has plans to start Bimzelx  but has not started this just yet    Prior treatments have included:  Systemic: brodalumab , Doxycycline,Minocycline, Clindamycin , Humira , Spironolactone , Cosentyx , Remicade, Simponi, Siliq    Local therapies:  L axilla deroofing 04/2023    I have reviewed old/outside medical records (please see above for summary).    REVIEW OF SYSTEMS:    A comprehensive review of 10 systems was negative except for pertinent positives noted in HPI.    Past Medical History[1]   Prior Radiation Therapy:no  Pacemaker: no  Pregnancy status: Will order pregnancy test prior to simulation  Collagen Vascular Disease:no    Past Surgical History[2]     Family History[3]     Social History     Occupational History    Not on file   Tobacco Use    Smoking status: Every Day     Current packs/day: 0.20     Average packs/day: 0.2 packs/day for 23.0 years (4.6 ttl pk-yrs)     Types: Cigarettes     Passive exposure: Current    Smokeless tobacco: Never   Vaping Use    Vaping status: Never Used   Substance and Sexual Activity    Alcohol use: Yes     Comment: Occasionally    Drug use: Not Currently    Sexual activity: Not Currently       ALLERGIES/MEDICATIONS:  Reviewed in EPIC    PHYSICAL EXAM:     Vital Signs for this encounter:  BSA: 1.78 meters squared  BP 113/64  - Pulse 65  - Temp 36.5 ??C (97.7 ??F) (Temporal)  - Resp 18  - Wt 72.8 kg (160 lb 9.6 oz)  - SpO2 100%  - BMI 29.37 kg/m??   Current Pain (Scale: 0 = no pain, 10 = extreme pain): 5  Karnofsky/Lansky Performance Status: 100, Fully active, able to carry on all pre-disease performed without restriction (ECOG equivalent 0)  General:   No acute distress, alert and oriented X 4   Psychiatric:  Normal mood and affect.  Converses clearly and emotionally appropriate.   HEENT:  EOMI, MMM  Cardio: RRR, no m/r/g  Respiratory: LCTAB, no w/r/r  Abdomen: Soft, non-tender, non-distended. Normal active bowel sounds  Neuro: CN III-XII intact grossly, AAOx3  Extremities:  No clubbing cyanosis, or edema  Skin: Generalized erythema in the right axilla with 2 nodular lesions noted, one of which had scant drainage.  In the left groin there were 2 nodular lesions, no active drainage    RADIOLOGY:  Imaging was personally reviewed as detailed in the history (see above).      PATHOLOGY:   Pathology was personally reviewed as detailed in the HPI.       Labs:  No results found for: WBC, HGB, HCT, PLT, LDH, CREATININE, AST, ALT, MG      Electronically signed by:  Franky Sprinkles, MD  Assistant Professor  [facility] Dept of Radiation Oncology  10/09/2023         [  1]   Past Medical History:  Diagnosis Date    Disorder of skin or subcutaneous tissue     Hidradenitis Suppurativa    Hidradenitis    [2] No past surgical history on file.  [3]   Family History  Problem Relation Age of Onset    Cancer Paternal Aunt         ovarian    Cancer Maternal Grandmother         kidney    Rashes / Skin problems Father         Had severe acne    Cancer Maternal Grandfather         Rectal/Pancreatic cancer    Clotting disorder Maternal Aunt         Suffered stroke at 36 due to blood clot    Cancer Maternal Aunt     Melanoma Neg Hx     Basal cell carcinoma Neg Hx     Squamous cell carcinoma Neg Hx

## 2023-10-09 NOTE — Unmapped (Signed)
 3Ps covered - urine needed, no pacemaker, no prior radiation. Discussed and gave pt information packet regarding radiation - Dr. Carylon credentials/contact information and general radiation handout (5 mintues).     Chaperone exam.    Consent witnessed.

## 2023-10-09 NOTE — Unmapped (Signed)
 Per chart, patient will stop Stelara  and continue Humira  along with Bimzelx     [facility]    Patient Onboarding/Medication Counseling    Debbie Gregory is a 33 y.o. female with hidradenitis suppurativa who I am counseling today on initiation of therapy.  I am speaking to the patient.    Was a Nurse, learning disability used for this call? No    Verified patient's date of birth / HIPAA.    Specialty medication(s) to be sent: Inflammatory Disorders: Bimzelx       Non-specialty medications/supplies to be sent: na      Medications not needed at this time: na         Bimzelx  (bimekizumab )    Medication & Administration     Dosage: Hidradenitis Suppurativa: Inject 320 mg at Week 0, 2, 4, 6, 8, 10, 12, 14 and 16, then every 4 weeks thereafter      Lab tests required prior to treatment initiation:  Tuberculosis: Tuberculosis screening resulted in a non-reactive Quantiferon TB Gold assay.  Liver Function Tests: LFTs, Alk Phos, and bilirubin are documented in the patient's chart.      Administration:     Prefilled syringe  1. Gather all supplies needed for injection on a clean, flat working surface: medication syringe(s) removed from packaging, alcohol swab, sharps container, etc.  2. Look at the medication label - look for correct medication, correct dose, and check the expiration date  3. Look at the medication - the liquid in the syringe should appear slightly pearly and colorless to pale brownish-yellow  4. Lay the syringe on a flat surface and allow it to warm up to room temperature for at least 30-45 minutes  5. Select injection site - you can use the front of your thigh or your belly (but not the area 2 inches around your belly button)  6. Prepare injection site - wash your hands and clean the skin at the injection site with an alcohol swab and let it air dry, do not touch the injection site again before the injection  7. Pull off the needle safety cap, do not remove until immediately prior to injection  8. Pinch the skin - with your hand not holding the syringe pinch up a fold of skin at the injection site using your forefinger and thumb  9. Insert the needle into the fold of skin at about a 45 degree angle - it's best to use a quick dart-like motion  10.Push the plunger down slowly as far as it will go until the syringe is empty  11. Check that the syringe is empty then remove pressure from plunger head. This will cause the needle to automatically retract and lock.    12. Dispose of the used syringe immediately in your sharps disposal container, do not attempt to recap the needle prior to disposing  13. If you see any blood at the injection site, press a cotton ball or gauze on the site and maintain pressure until the bleeding stops, do not rub the injection site      Adherence/Missed dose instructions:  If your injection is given more than 4 days after your scheduled injection date - consult your pharmacist for additional instructions on how to adjust your dosing schedule.        Goals of Therapy       Hidradenitis Suppurativa  Reduce the frequency and severity of new lesions  Minimize pain and suppuration  Prevent disease progression and limit scarring  Maintenance  of effective psychosocial functioning      Side Effects & Monitoring Parameters     Injection site reaction (redness, irritation, inflammation localized to the site of administration)  Signs of a common cold - minor sore throat, runny or stuffy nose, etc.  Diarrhea  Mood changes/suicidal ideation  Liver Function Tests    The following side effects should be reported to the provider:  Signs of a hypersensitivity reaction - rash; hives; itching; red, swollen, blistered, or peeling skin; wheezing; tightness in the chest or throat; difficulty breathing, swallowing, or talking; swelling of the mouth, face, lips, tongue, or throat; etc.  Reduced immune function - report signs of infection such as fever; chills; body aches; very bad sore throat; ear or sinus pain; cough; more sputum or change in color of sputum; pain with passing urine; wound that will not heal, etc.  Also at a slightly higher risk of some malignancies (mainly skin and blood cancers) due to this reduced immune function.  In the case of signs of infection - the patient should hold the next dose of Bimzelx ?? and call your primary care provider to ensure adequate medical care.  Treatment may be resumed when infection is treated and patient is asymptomatic.  Muscle pain or weakness  Shortness of breath      Warnings, Precautions, & Contraindications     Have your bloodwork checked as you have been told by your prescriber  Talk with your doctor if you are pregnant, planning to become pregnant, or breastfeeding  Discuss the possible need for holding your dose(s) of Bimzelx ?? when a planned procedure is scheduled with the prescriber as it may delay healing/recovery timeline       Drug/Food Interactions     Medication list reviewed in Epic. The patient was instructed to inform the care team before taking any new medications or supplements. No drug interactions identified.   Talk with you prescriber or pharmacist before receiving any live vaccinations while taking this medication and after you stop taking it      Storage, Handling Precautions, & Disposal     Store this medication in the refrigerator.  Do not freeze  May store intact Bimzelx  pens and 160 mg/mL prefilled syringes at <=25??C (<=77??F) for up to 30 days; do NOT return to the refrigerator   Store in original packaging, protected from light  Do not shake  Dispose of used syringes/pens in a sharps disposal container           Current Medications (including OTC/herbals), Comorbidities and Allergies     Current Medications[1]    Allergies[2]    Problem List[3]    Medication list has been reviewed and updated in Epic: Yes    Allergies have been reviewed and updated in Epic: Yes    Appropriateness of Therapy     Acute infections noted within Epic:  No active infections  Patient reported infection: None    Is the medication and dose appropriate considering the patient???s diagnosis, treatment, and disease journey, comorbidities, medical history, current medications, allergies, therapeutic goals, self-administration ability, and access barriers? Yes    Prescription has been clinically reviewed: Yes      Baseline Quality of Life Assessment      How many days over the past month did your HS  keep you from your normal activities? For example, brushing your teeth or getting up in the morning. Patient declined to answer    Financial Information     Medication Assistance provided: Prior Authorization  Anticipated copay of $4 reviewed with patient. Verified delivery address.    Delivery Information     Scheduled delivery date: 10/7    Expected start date: ~10/13 - Patient took Stelara  ~ 2 weeks ago and we discussed waiting 4 weeks from then to begin given risk of immunosuppression with Humira  and Stelara  and Bimzelx       Medication will be delivered via UPS to the prescription address in Epic [place].  This shipment will not require a signature.      Explained the services we provide at [facility] and that each month we would call to set up refills.  Stressed importance of returning phone calls so that we could ensure they receive their medications in time each month.  Informed patient that we should be setting up refills 7-10 days prior to when they will run out of medication.  A pharmacist will reach out to perform a clinical assessment periodically.  Informed patient that a welcome packet, containing information about our pharmacy and other support services, a Notice of Privacy Practices, and a drug information handout will be sent.      The patient or caregiver noted above participated in the development of this care plan and knows that they can request review of or adjustments to the care plan at any time.      Patient or caregiver verbalized understanding of the above information as well as how to contact the pharmacy at [phone] option 4 with any questions/concerns.  The pharmacy is open Monday through Friday 8:30am-4:30pm.  A pharmacist is available 24/7 via pager to answer any clinical questions they may have.    Patient Specific Needs     Does the patient have any physical, cognitive, or cultural barriers? No    Does the patient have adequate living arrangements? (i.e. the ability to store and take their medication appropriately) Yes    Did you identify any home environmental safety or security hazards? No    Patient prefers to have medications discussed with  Patient     Is the patient or caregiver able to read and understand education materials at a high school level or above? Yes    Patient's primary language is  English     Is the patient high risk? No    Does the patient have an additional or emergency contact listed in their chart? Yes    SOCIAL DETERMINANTS OF HEALTH     At the [facility], we have learned that life circumstances - like trouble affording food, housing, utilities, or transportation can affect the health of many of our patients.   That is why we wanted to ask: are you currently experiencing any life circumstances that are negatively impacting your health and/or quality of life? Patient declined to answer    Social Drivers of Health     Food Insecurity: Not on file   Tobacco Use: High Risk (10/09/2023)    Patient History     Smoking Tobacco Use: Every Day     Smokeless Tobacco Use: Never     Passive Exposure: Current   Transportation Needs: Not on file   Alcohol Use: Not on file   Housing: Not on file   Physical Activity: Not on file   Utilities: Not on file   Stress: Not on file   Interpersonal Safety: Not on file   Substance Use: Not on file (11/14/2022)   Intimate Partner Violence: Not on file   Social Connections:  Not on file   Financial Resource Strain: Not on file   Health Literacy: Not on file   Internet Connectivity: Not on file       Would you be willing to receive help with any of the needs that you have identified today? Not applicable       [facility] Specialty Pharmacist         [1]   Current Outpatient Medications   Medication Sig Dispense Refill    bimekizumab -bkzx (BIMZELX ) 320 mg/2 mL subcutaneous injection Inject the contents of 1 syringe (320mg ) under the skin every 4 weeks for maintenance 2 mL 11    bimekizumab -bkzx (BIMZELX ) 320 mg/2 mL subcutaneous injection Inject the contents of 1 syringe (320mg ) under the skin every 2 weeks on week 0, 2, 4, 6, 8, 10, 12, 14, 16 4 mL 4    empty container Misc Use as directed to dispose of Humira  syringes. 1 each 2    clindamycin  (CLEOCIN  T) 1 % Swab Apply to face once daily 60 each 11    HUMIRA  SYRINGE CITRATE FREE 40 MG/0.4 ML Inject the contents of 2 syringes (80 mg total) under the skin every seven (7) days. 8 each 11    HYDROcodone -acetaminophen  (NORCO) 5-325 mg per tablet Take 1-2 tablets every 6 hours as needed for pain 15 tablet 0    predniSONE  (DELTASONE ) 10 MG tablet 40mg  daily for 1 week, 30mg  daily x 1 week, 20mg  daily x 1 week, 10mg  daily x 1 week 70 tablet 0    propranolol (INDERAL) 20 MG tablet TAKE 1 TABLET BY MOUTH EVERY DAY      spironolactone  (ALDACTONE ) 100 MG tablet Take 1 tablet (100 mg total) by mouth daily. 90 tablet 3    tretinoin  (RETIN-A ) 0.025 % cream Apply thin layer to face once daily 45 g 11    ustekinumab  (STELARA ) 90 mg/mL Syrg syringe Inject the contents of 1 syringe (90 mg total) under the skin every twenty-eight (28) days. 1 mL 11     No current facility-administered medications for this visit.   [2]   Allergies  Allergen Reactions    Dicyclomine Hcl Nausea Only   [3]   Patient Active Problem List  Diagnosis    Hidradenitis suppurativa

## 2023-10-09 NOTE — Unmapped (Signed)
 [facility] Medication Onboarding    Specialty Medication: BIMZELX  320 mg/2 mL subcutaneous injection (bimekizumab -bkzx)  Prior Authorization: Approved   Financial Assistance: No - copay  <$25  Final Copay/Day Supply: $4 / 28 (LD+MD)    Insurance Restrictions: None     Notes to Pharmacist:   Credit Card on File: yes  Start Date on Rx:  10/05/23    The triage team has completed the benefits investigation and has determined that the patient is able to fill this medication at [facility]. Please contact the patient to complete the onboarding or follow up with the prescribing physician as needed.

## 2023-10-10 ENCOUNTER — Ambulatory Visit: Admit: 2023-10-10 | Discharge: 2023-11-09 | Payer: Medicaid (Managed Care)

## 2023-10-10 ENCOUNTER — Ambulatory Visit
Admit: 2023-10-10 | Discharge: 2023-11-09 | Payer: Medicaid (Managed Care) | Attending: Radiation Oncology | Primary: Radiation Oncology

## 2023-10-10 ENCOUNTER — Inpatient Hospital Stay: Admit: 2023-10-10 | Discharge: 2023-10-26 | Payer: Medicaid (Managed Care)

## 2023-10-10 ENCOUNTER — Encounter: Admit: 2023-10-10 | Payer: Medicaid (Managed Care) | Attending: Radiation Oncology | Primary: Radiation Oncology

## 2023-10-12 LAB — HM PAP SMEAR: HPV Aptima: NEGATIVE

## 2023-10-16 NOTE — Unmapped (Signed)
 Copied from CRM [id number]. Topic: Return Scheduling - Cancel/Reschedule  >> Oct 16, 2023 12:40 PM Almeda NOVAK wrote:      Erskin ,    Patient Debbie Gregory contacted the Communication Center to reschedule their appointments for tomorrow.  The original appointments have not been cancelled.    Cancellation Reason: per pt request    Patient's appointments require reschedule.    Oncology Return Appointment Symptoms Y N: NO - Send to return scheduler     Thank you,  Almeda GORMAN Avena  [facility]   [phone]

## 2023-10-17 ENCOUNTER — Ambulatory Visit: Admitting: Physical Therapy

## 2023-10-24 ENCOUNTER — Ambulatory Visit: Attending: Student | Admitting: Physical Therapy

## 2023-10-24 ENCOUNTER — Encounter: Payer: Self-pay | Admitting: Physical Therapy

## 2023-10-24 DIAGNOSIS — R262 Difficulty in walking, not elsewhere classified: Secondary | ICD-10-CM | POA: Diagnosis present

## 2023-10-24 DIAGNOSIS — M25552 Pain in left hip: Secondary | ICD-10-CM | POA: Insufficient documentation

## 2023-10-24 DIAGNOSIS — M25572 Pain in left ankle and joints of left foot: Secondary | ICD-10-CM | POA: Diagnosis present

## 2023-10-24 DIAGNOSIS — M25571 Pain in right ankle and joints of right foot: Secondary | ICD-10-CM | POA: Diagnosis present

## 2023-10-24 NOTE — Therapy (Signed)
 OUTPATIENT PHYSICAL THERAPY LOWER EXTREMITY TREATMENT   Patient Name: Angela Hawkins MRN: [id number] DOB:1990/02/22, 33 y.o., female Today's Date: 10/24/2023  END OF SESSION:  PT End of Session - 10/24/23 1258     Visit Number 2    Date for Recertification  12/03/23    PT Start Time 1300    PT Stop Time 1345    PT Time Calculation (min) 45 min    Activity Tolerance Patient tolerated treatment well    Behavior During Therapy [facility] for tasks assessed/performed          History reviewed. No pertinent past medical history. Past Surgical History:  Procedure Laterality Date   BREAST ENHANCEMENT SURGERY     There are no active problems to display for this patient.   PCP: Alvera Reagin, PA  REFERRING PROVIDER: Aniceto Eva Grebe, PA-C ( B ankle sprains), Redmon, Noelle, PA (L hip)  REFERRING DIAG: B ankle sprains, R hallux fracture, L lateral hip pain  THERAPY DIAG:  Pain in left hip  Pain in left ankle and joints of left foot  Pain in right ankle and joints of right foot  Difficulty in walking, not elsewhere classified  Rationale for Evaluation and Treatment: Rehabilitation  ONSET DATE: L hip April 2025, B ankles July 2025  SUBJECTIVE:   SUBJECTIVE STATEMENT: Currently having a skin flair up, ankle have been better, hip hurts more when laying in bed  PERTINENT HISTORY: [place] off neighbors steps when taking  PAIN:  Are you having pain? Yes: NPRS scale: 0 to 4 Pain location: B lat ankles, L lat/ ant hip Pain description: ankles deep, more off balance and unstable, L hip sharp Aggravating factors: L hip liting L leg on and off bed, B ankles laterally Relieving factors: sits  PRECAUTIONS: None  RED FLAGS: None   WEIGHT BEARING RESTRICTIONS: No  FALLS:  Has patient fallen in last 6 months? Yes. Number of falls 1  LIVING ENVIRONMENT: Lives with: lives with their family Lives in: House/apartment Stairs: outdoors, 3 -4  Has following equipment at home:  None  OCCUPATION: at home, has 35 y o   PLOF: Independent  PATIENT GOALS: be better balanced and stable   NEXT MD VISIT: Nov 2025  OBJECTIVE:  Note: Objective measures were completed at Evaluation unless otherwise noted.  DIAGNOSTIC FINDINGS: R distal hallux fx healed  PATIENT SURVEYS:  Lower Extremity Functional Score: 48 / 80 = 60.0 %  COGNITION: Overall cognitive status: Within functional limits for tasks assessed     SENSATION: WFL   POSTURE: with sit to stand note overpronation L ankle and L knee In standing B navicular heigh  PALPATION: L prirformis, post L hip tender . Non tender L TFL  LOWER EXTREMITY ROM:all ROM B ankles wnl AROM all planes L hip IR sluggish and sore, lacking 10 degrees as compared to R   LOWER EXTREMITY MMT:  MMT Right eval Left eval  Hip flexion    Hip extension lock bridge unilateral  4 4  Hip abduction    Hip adduction    Hip internal rotation    Hip external rotation  3+  Knee flexion    Knee extension    Ankle dorsiflexion    Ankle plantarflexion B heel raises standing  3+, poor eccentric control 3+poor eccentric conrol  Ankle inversion    Ankle eversion 3+ 3+   (Blank rows = not tested)  FUNCTIONAL TESTS:  30 seconds chair stand test no UE support:  16 reps  noted increased overpronation L ankle and L knee with this motion  GAIT: Distance walked: in clinic 70 to 80' Assistive device utilized: None Level of assistance: Modified independence Comments: antalgic on L, shortened stance time on L  noted, slower cadence noted B                                                                                                                                TREATMENT DATE:  10/24/23 Bike L 3 x 6 min HS curls 25lb 2x10 Leg Ext 10lb 2x10 Hip add ball squeeze 2x10 Hip abd green 2x10  Sit to stands 2x10 Bilateral 4 way ankle green x 10 each   10/08/23:   Evaluation, education in the exercises as indicated below, also  education throughout session regarding eval findings, treatment plan, expected outcomes  PATIENT EDUCATION:  Education details: POC< goals Person educated: Patient Education method: Explanation, Demonstration, Tactile cues, Verbal cues, and Handouts Education comprehension: verbalized understanding  HOME EXERCISE PROGRAM: Access Code: [email or web address] URL: [email or web address] Date: 10/08/2023 Prepared by: Amy Speaks  Exercises - Seated Hip Abduction  - 1 x daily - 7 x weekly - 3 sets - 10 reps - Ankle and Toe Plantarflexion with Resistance  - 1 x daily - 7 x weekly - 3 sets - 10 reps - Seated Ankle Eversion with Resistance  - 1 x daily - 7 x weekly - 3 sets - 10 reps  ASSESSMENT:  CLINICAL IMPRESSION: Patient is a 33 y.o. female who was  seen today for physical therapy treatment for B ankle sprains as the result of a fall in July, as well as L lateral hip pain which has been occurring since April for which she had an ongoing. Today she enters doing well. Some hip soreness reported after leg curls and extensions.  This tolerable soreness remained during session.  Cues needed with sit to stands for sequencing and LE placement. Good bilateral ankle ROM with 4 way strengthening.  She should benefit from a progressive strengthening and balance regimen to address recovery of her B ankle function as well as manual techniques and progressive hip stability training as well.   OBJECTIVE IMPAIRMENTS: decreased activity tolerance, decreased balance, decreased knowledge of condition, decreased mobility, difficulty walking, decreased strength, increased fascial restrictions, impaired perceived functional ability, improper body mechanics, postural dysfunction, and pain.   ACTIVITY LIMITATIONS: carrying, lifting, standing, squatting, sleeping, stairs, transfers, bed mobility, and locomotion level  PARTICIPATION LIMITATIONS: cleaning, laundry, community activity, and yard work  PERSONAL FACTORS:  Fitness, Past/current experiences, Time since onset of injury/illness/exacerbation, and 1-2 comorbidities: chronic L lat hip pain,  are also affecting patient's functional outcome.   REHAB POTENTIAL: Good  CLINICAL DECISION MAKING: Evolving/moderate complexity  EVALUATION COMPLEXITY: Moderate   GOALS: Goals reviewed with patient? Yes  SHORT TERM GOALS: Target date: 2 weeks, 10/22/23 I HEP Baseline:initiated at eval Goal status: Progressing 10/24/23   LONG TERM GOALS: Target date: 8 weeks, 12/03/23  Lower Extremity Functional Score: 48 / 80 = 60.0 % Baseline:  Goal status: INITIAL  2.  30 sec sit to stand improve from 16 to 20 or greater with symmetrical movement B LE's Baseline:  Goal status: INITIAL  3.  Patient to maintain 30 sec unilateral standing on each foot which is normal  Baseline: unable Goal status: INITIAL  4.  B ankle strength and L hip ER strength improve to wnl Baseline: see chart above Goal status: INITIAL  5.  Gait on even, uneven terrain, with equal stride length B, no antalgia or balance loss , over 800' with speed 1.0 or greater Baseline: antalgic , slow gait Goal status: INITIAL    PLAN:  PT FREQUENCY: 1x/week  PT DURATION: 8 weeks  PLANNED INTERVENTIONS: 97110-Therapeutic exercises, 97530- Therapeutic activity, 97112- Neuromuscular re-education, 97535- Self Care, [id number]- Manual therapy, [phone]- Ionotophoresis 4mg /ml Dexamethasone, Patient/Family education, Cryotherapy, and Moist heat  PLAN FOR NEXT SESSION: progress with additional ankle strength and stability training, manual techniques to reduce muscular restrictions L lat/post hip, progress with B LE strength   Tanda KANDICE Sorrow, PTA, DPT, OCS 10/24/2023, 12:58 PM

## 2023-10-25 ENCOUNTER — Ambulatory Visit: Admit: 2023-10-25 | Payer: Medicaid (Managed Care)

## 2023-10-25 ENCOUNTER — Ambulatory Visit: Admit: 2023-10-25 | Payer: Medicaid (Managed Care) | Attending: Radiation Oncology | Primary: Radiation Oncology

## 2023-10-25 ENCOUNTER — Ambulatory Visit: Admit: 2023-10-25 | Discharge: 2023-10-26 | Payer: Medicaid (Managed Care)

## 2023-10-25 DIAGNOSIS — L732 Hidradenitis suppurativa: Principal | ICD-10-CM

## 2023-10-25 LAB — PREGNANCY, URINE: PREGNANCY TEST URINE: NEGATIVE

## 2023-10-30 DIAGNOSIS — L732 Hidradenitis suppurativa: Principal | ICD-10-CM

## 2023-10-30 DIAGNOSIS — Z79899 Other long term (current) drug therapy: Principal | ICD-10-CM

## 2023-10-30 MED ORDER — HUMIRA SYRINGE CITRATE FREE 40 MG/0.4 ML
SUBCUTANEOUS | 11 refills | 28.00000 days | Status: CP
Start: 2023-10-30 — End: ?

## 2023-10-31 ENCOUNTER — Ambulatory Visit

## 2023-11-06 MED ORDER — CEFDINIR 300 MG CAPSULE
ORAL_CAPSULE | ORAL | 2 refills | 0.00000 days | Status: CP
Start: 2023-11-06 — End: ?

## 2023-11-07 ENCOUNTER — Ambulatory Visit

## 2023-11-08 DIAGNOSIS — Z79899 Other long term (current) drug therapy: Principal | ICD-10-CM

## 2023-11-08 DIAGNOSIS — L732 Hidradenitis suppurativa: Principal | ICD-10-CM

## 2023-11-08 NOTE — Progress Notes (Signed)
 Debbie Gregory reports she's tolerated her Bimzelx  well but she hasn't seen any improvements in her HS symptoms yet. With this delivery, she'll have completed 6/9 loading doses.     [facility] Clinical Assessment & Refill Coordination Note    Debbie Gregory, DOB: [birth year]  Phone: [phone] (home)     All above HIPAA information was verified with patient.     Was a nurse, learning disability used for this call? No    Specialty Medication(s):   Inflammatory Disorders: Bimzelx      Current Medications[1]     Changes to medications: Candid reports no changes at this time.    Medication list has been reviewed and updated in Epic: Yes    Allergies[2]    Changes to allergies: No    Allergies have been reviewed and updated in Epic: Yes    SPECIALTY MEDICATION ADHERENCE     Bimzelx  320 mg: 0 doses of medicine on hand      Medication Adherence    Patient reported X missed doses in the last month: 0  Specialty Medication: Bimzelx           Specialty medication(s) dose(s) confirmed: Regimen is correct and unchanged.     Are there any concerns with adherence? No    Adherence counseling provided? Not needed    CLINICAL MANAGEMENT AND INTERVENTION      Clinical Benefit Assessment:    Do you feel the medicine is effective or helping your condition? Yes    Clinical Benefit counseling provided? Not needed    Adverse Effects Assessment:    Are you experiencing any side effects? No    Are you experiencing difficulty administering your medicine? No    Quality of Life Assessment:    Quality of Life    Rheumatology  Oncology  Dermatology  1. What impact has your specialty medication had on the symptoms of your skin condition (i.e. itchiness, soreness, stinging)?: None  2. What impact has your specialty medication had on your comfort level with your skin?: None  Cystic Fibrosis          How many days over the past month did your HS  keep you from your normal activities? For example, brushing your teeth or getting up in the morning. Patient declined to answer    Have you discussed this with your provider? Not needed    Acute Infection Status:    Acute infections noted within Epic:  No active infections    Patient reported infection: None    Therapy Appropriateness:    Is the medication and dose appropriate considering the patient???s diagnosis, treatment, and disease journey, comorbidities, medical history, current medications, allergies, therapeutic goals, self-administration ability, and access barriers? Yes, therapy is appropriate and should be continued     Clinical Intervention:    Was an intervention completed as part of this clinical assessment? No    DISEASE/MEDICATION-SPECIFIC INFORMATION      For patients on injectable medications: Next injection is scheduled for 11/.    Chronic Inflammatory Diseases: Have you experienced any flares in the last month? Yes, not worse than baseline  Has this been reported to your provider? No    PATIENT SPECIFIC NEEDS     Does the patient have any physical, cognitive, or cultural barriers? No    Is the patient high risk? No    Does the patient require physician intervention or other additional services (i.e., nutrition, smoking cessation, social work)? No    Does the patient  have an additional or emergency contact listed in their chart? Yes    SOCIAL DETERMINANTS OF HEALTH     At the [facility], we have learned that life circumstances - like trouble affording food, housing, utilities, or transportation can affect the health of many of our patients.   That is why we wanted to ask: are you currently experiencing any life circumstances that are negatively impacting your health and/or quality of life? Patient declined to answer    Social Drivers of Health     Food Insecurity: Not on file   Tobacco Use: High Risk (10/24/2023)    Received from [facility]    Patient History     Smoking Tobacco Use: Every Day     Smokeless Tobacco Use: Unknown     Passive Exposure: Not on file   Transportation Needs: Not on file   Alcohol Use: Not on file   Housing: Not on file   Physical Activity: Not on file   Utilities: Not on file   Stress: Not on file   Interpersonal Safety: Not on file   Substance Use: Not on file (11/14/2022)   Intimate Partner Violence: Not on file   Social Connections: Not on file   Financial Resource Strain: Not on file   Health Literacy: Not on file   Internet Connectivity: Not on file       Would you be willing to receive help with any of the needs that you have identified today? Not applicable       SHIPPING     Specialty Medication(s) to be Shipped:   Inflammatory Disorders: Bimzelx     Other medication(s) to be shipped: No additional medications requested for fill at this time    Specialty Medications not needed at this time: N/A     Changes to insurance: No    Cost and Payment: Patient has a copay of $4. They are aware and have authorized the pharmacy to charge the credit card on file.    Delivery Scheduled: Yes, Expected medication delivery date: 11/4.     Medication will be delivered via UPS to the confirmed prescription address in [facility].    The patient will receive a drug information handout for each medication shipped and additional FDA Medication Guides as required.  Verified that patient has previously received a Conservation Officer, Historic Buildings and a Surveyor, Mining.    The patient or caregiver noted above participated in the development of this care plan and knows that they can request review of or adjustments to the care plan at any time.      All of the patient's questions and concerns have been addressed.    Dyquan Minks [facility] Specialty Pharmacist         [1]   Current Outpatient Medications   Medication Sig Dispense Refill    bimekizumab -bkzx (BIMZELX ) 320 mg/2 mL subcutaneous injection Inject the contents of 1 syringe (320mg ) under the skin every 4 weeks for maintenance 2 mL 11    bimekizumab -bkzx (BIMZELX ) 320 mg/2 mL subcutaneous injection Inject the contents of 1 syringe (320mg ) under the skin every 2 weeks on week 0, 2, 4, 6, 8, 10, 12, 14, 16 4 mL 4    cefdinir  (OMNICEF ) 300 MG capsule 300mg  twice daily 60 capsule 2    clindamycin  (CLEOCIN  T) 1 % Swab Apply to face once daily 60 each 11    empty container Misc Use as directed to  dispose of Humira  syringes. 1 each 2    HUMIRA  SYRINGE CITRATE FREE 40 MG/0.4 ML Inject the contents of 2 syringes (80 mg total) under the skin every seven (7) days. 8 each 11    HYDROcodone -acetaminophen  (NORCO) 5-325 mg per tablet Take 1-2 tablets every 6 hours as needed for pain 15 tablet 0    predniSONE  (DELTASONE ) 10 MG tablet 40mg  daily for 1 week, 30mg  daily x 1 week, 20mg  daily x 1 week, 10mg  daily x 1 week 70 tablet 0    propranolol (INDERAL) 20 MG tablet TAKE 1 TABLET BY MOUTH EVERY DAY      spironolactone  (ALDACTONE ) 100 MG tablet Take 1 tablet (100 mg total) by mouth daily. 90 tablet 3    tretinoin  (RETIN-A ) 0.025 % cream Apply thin layer to face once daily 45 g 11    ustekinumab  (STELARA ) 90 mg/mL Syrg syringe Inject the contents of 1 syringe (90 mg total) under the skin every twenty-eight (28) days. 1 mL 11     No current facility-administered medications for this visit.   [2]   Allergies  Allergen Reactions    Dicyclomine Hcl Nausea Only

## 2023-11-10 ENCOUNTER — Ambulatory Visit: Admit: 2023-11-10 | Payer: Medicaid (Managed Care) | Attending: Radiation Oncology | Primary: Radiation Oncology

## 2023-11-10 ENCOUNTER — Ambulatory Visit: Admit: 2023-11-10 | Payer: Medicaid (Managed Care)

## 2023-11-10 ENCOUNTER — Encounter: Admit: 2023-11-10 | Payer: Medicaid (Managed Care) | Attending: Radiation Oncology | Primary: Radiation Oncology

## 2023-11-12 ENCOUNTER — Ambulatory Visit

## 2023-11-12 DIAGNOSIS — L732 Hidradenitis suppurativa: Principal | ICD-10-CM

## 2023-11-12 MED ORDER — HUMIRA SYRINGE CITRATE FREE 40 MG/0.4 ML
SUBCUTANEOUS | 11 refills | 28.00000 days | Status: CP
Start: 2023-11-12 — End: ?

## 2023-11-12 MED FILL — BIMZELX 320 MG/2 ML SUBCUTANEOUS SYRINGE: SUBCUTANEOUS | 56 days supply | Qty: 8 | Fill #1

## 2023-11-14 DIAGNOSIS — L732 Hidradenitis suppurativa: Principal | ICD-10-CM

## 2023-11-14 NOTE — Progress Notes (Signed)
 RADIATION TREATMENT MANAGEMENT NOTE     Encounter Date: 11/14/2023  Patient Name: Debbie Gregory  Medical Record Number: [id number]    DIAGNOSIS:  33yo with hidradenitis suppurativa with symptomatic lesions in the left groin and right axilla refractory to medical management.    ASSESSMENT:     Radiation Treatments       Active   LT Groin (Started on 11/13/2023)   Most recent fraction: 250 cGy given on 11/14/2023   Total given: 500 cGy / 750 cGy  (2 of 3 fractions)   Elapsed Days: 1   Technique: AP        RT Axilla (Started on 11/13/2023)   Most recent fraction: 250 cGy given on 11/14/2023   Total given: 500 cGy / 750 cGy  (2 of 3 fractions)   Elapsed Days: 1   Technique: Electrons                     Karnofsky/Lansky Performance Status: 100, Fully active, able to carry on all pre-disease performed without restriction (ECOG equivalent 0)  Chemotherapy/Systemic therapy:not administered  Clinical Trial:   no    RECOMMENDATIONS:  Plan for Therapy: Continue treatment as planned  Skin:  No issues over treated area  Pain:  Unchanged from baseline  Follow-up:  Follow-up 6 weeks    SUBJECTIVE: Overall doing well without any new issues since starting radiation.  No skin changes, no HS flare, good energy.    PHYSICAL EXAM:  Vital Signs for this encounter:  There were no vitals taken for this visit.  Last weight:    Wt Readings from Last 4 Encounters:   10/09/23 72.8 kg (160 lb 9.6 oz)   02/12/23 72.9 kg (160 lb 12.8 oz)   08/07/22 72.8 kg (160 lb 8 oz)   07/24/18 87.2 kg (192 lb 3.9 oz)     General:  Alert and Orientated X 3.  No acute distress.    Skin: No changes over treated skin    I have reviewed the patient's dose delivery, dosimetry, lab tests, patient treatment set-up, port films, treatment parameters and x-rays.    Krysia Zahradnik, MD  Assistant Professor  [facility] Dept of Radiation Oncology  11/14/2023

## 2023-11-14 NOTE — Progress Notes (Signed)
 SiteLast TxDose  Rx:LT Groin: 11/14/2023: 500/750 cGy  Rx:RT Axilla: 11/14/2023: 500/750 cGy    No pain or concerns reported today. Pt states she is tired today.

## 2023-11-23 ENCOUNTER — Encounter: Payer: Self-pay | Admitting: Obstetrics and Gynecology

## 2023-11-23 ENCOUNTER — Ambulatory Visit (INDEPENDENT_AMBULATORY_CARE_PROVIDER_SITE_OTHER): Admitting: Obstetrics and Gynecology

## 2023-11-23 VITALS — BP 102/62 | HR 81 | Temp 98.2°F | Ht 62.75 in | Wt 154.0 lb

## 2023-11-23 DIAGNOSIS — R000 Tachycardia, unspecified: Secondary | ICD-10-CM | POA: Insufficient documentation

## 2023-11-23 DIAGNOSIS — K589 Irritable bowel syndrome without diarrhea: Secondary | ICD-10-CM | POA: Insufficient documentation

## 2023-11-23 DIAGNOSIS — N926 Irregular menstruation, unspecified: Secondary | ICD-10-CM | POA: Diagnosis not present

## 2023-11-23 DIAGNOSIS — J309 Allergic rhinitis, unspecified: Secondary | ICD-10-CM | POA: Insufficient documentation

## 2023-11-23 DIAGNOSIS — F17200 Nicotine dependence, unspecified, uncomplicated: Secondary | ICD-10-CM | POA: Insufficient documentation

## 2023-11-23 DIAGNOSIS — Z3009 Encounter for other general counseling and advice on contraception: Secondary | ICD-10-CM | POA: Insufficient documentation

## 2023-11-23 NOTE — Progress Notes (Signed)
 33 y.o. G12P1001 female with tachycardia, acne and HS (dermatology), tobacco use, migraines w/o aura here for hysterectomy consult. Single. 15yo son.  Patient's last menstrual period was 10/09/2023 (exact date). Period Duration (Days): 6 Period Pattern: Regular Menstrual Flow: Moderate Menstrual Control: Maxi pad Dysmenorrhea: (!) Moderate Dysmenorrhea Symptoms: Headache, Diarrhea  She reports her HS is worsened by menstrual bleeding and the need to wear pads. Her HS is managed by dermatology. She is on humira for this. Dermatology is also managing her acne for which she is on spironolactone and clindamycin gel and retinoid products.  She declines future child bearing.  She has a 110 year old son.  For menstrual suppression, she has used the following: She used Depo for 10 yrs. She had amenorrhea during this time, however she stopped secondary to weight gain. Took COC for 1 year but often forgot to take.  Had irritation from contraceptive patch.  She is not interested in IUD.  Birth control: none Sexually active: dating, no current partner  Smoking: 1-3 cig/day  GYN HISTORY: No significant history  OB History  Gravida Para Term Preterm AB Living  1 1 1   1   SAB IAB Ectopic Multiple Live Births      1    # Outcome Date GA Lbr Len/2nd Weight Sex Type Anes PTL Lv  1 Term      Vag-Spont   LIV   History reviewed. No pertinent past medical history. Past Surgical History:  Procedure Laterality Date   BREAST ENHANCEMENT SURGERY     Current Outpatient Medications on File Prior to Visit  Medication Sig Dispense Refill   adalimumab (HUMIRA, 2 SYRINGE,) 40 MG/0.4ML prefilled syringe Inject 80 mg into the skin.     Bimekizumab-bkzx 320 MG/2ML SOSY Inject the contents of 1 syringe (320mg ) under the skin every 2 weeks on week 0, 2, 4, 6, 8, 10, 12, 14, 16     CLINDACIN ETZ 1 % SWAB Apply topically daily.     ibuprofen  (ADVIL ,MOTRIN ) 800 MG tablet Take 1 tablet (800 mg total) by  mouth every 8 (eight) hours as needed for mild pain or moderate pain. 15 tablet 0   propranolol (INDERAL) 20 MG tablet Take 20 mg by mouth daily.     spironolactone (ALDACTONE) 50 MG tablet Take 50 mg by mouth.     tretinoin (RETIN-A) 0.025 % cream 1 application in the evening to face Externally Once a day     triamcinolone cream (KENALOG) 0.1 % Apply to the skin neck down 1-2 times a day for itchy skin / rash     No current facility-administered medications on file prior to visit.   Allergies  Allergen Reactions   Dicyclomine Nausea Only   Norelgestromin-Eth Estradiol Nausea And Vomiting and Other (See Comments)   Dicyclomine Hcl Nausea Only      PE Today's Vitals   11/23/23 1045  BP: 102/62  Pulse: 81  Temp: 98.2 F (36.8 C)  TempSrc: Oral  SpO2: 98%  Weight: 154 lb (69.9 kg)  Height: 5' 2.75 (1.594 m)   Body mass index is 27.5 kg/m.  Physical Exam Vitals reviewed.  Constitutional:      General: She is not in acute distress.    Appearance: Normal appearance.  HENT:     Head: Normocephalic and atraumatic.     Nose: Nose normal.  Eyes:     Extraocular Movements: Extraocular movements intact.     Conjunctiva/sclera: Conjunctivae normal.  Cardiovascular:     Rate and  Rhythm: Normal rate and regular rhythm.     Heart sounds: No murmur heard.    No friction rub. No gallop.  Pulmonary:     Effort: Pulmonary effort is normal. No respiratory distress.     Breath sounds: Normal breath sounds. No stridor. No wheezing, rhonchi or rales.  Musculoskeletal:        General: Normal range of motion.     Cervical back: Normal range of motion.  Neurological:     General: No focal deficit present.     Mental Status: She is alert.  Psychiatric:        Mood and Affect: Mood normal.        Behavior: Behavior normal.     Assessment and Plan:        Menstrual cycle problem -     CBC  Patient reports normal cycles previously suppressed with Depo, however she stopped this due  to weight gain. Since then she has found that her menstrual cycle worsens her hidradenitis suppurativa. She is being followed by dermatology and is on Humira for at bedtime. She has completed childbearing and ultimately desires a hysterectomy for menstrual suppression and permanent sterilization.  We discussed alternatives including combined oral contraceptives, the Mirena IUD, tubal sterilization.  At this time, she declines alternative forms of therapy.  We discussed RALTH, BS is an outpatient procedure. Reviewed that  recovery is usually 6 weeks with additional 4 weeks of pelvic rest. Risks including infections, bleeding, and damage to surrounding organs reviewed.    We also discussed laparoscopic bilateral salpingectomy is an outpatient procedure with 1-2wk recovery.  We will check CBC to evaluate for presence of menorrhagia. If normal CBC, then I would recommend use of contraceptive vaginal ring for menstrual suppression and treatment of HS and acne.  If moderate anemia is present, then she would meet criteria for menorrhagia with regular cycle that has failed medical therapies.  Due for PAP- RTO for annual in 3 months  45 mins  total time was spent for this patient encounter, including preparation, face-to-face counseling with the patient, coordination of care, and documentation of the encounter.   Vera LULLA Pa, MD

## 2023-11-23 NOTE — Assessment & Plan Note (Deleted)
 Contraceptive options were reviewed, including implant, IUDs and sterilization as indicated. Patient desires permanent sterilization.  Recommend via laparoscopic bilateral salpingectomy with general endotracheal anesthesia. Reviewed benefits including unintended pregnancy rates <1% and decreased risk of fallopian tubes and ovary cancers, as well as risks including regret, inability for reversal, bleeding, infection, and damage to surrounding organs and vessels. Patient agrees to blood transfusion if indicated. Reviewed outpatient procedure that will require transportation to hospital and 1-2 weeks out of work. NPO after midnight.  Preop checklist: Antibiotics: none DVT ppx: SCDs Postop visit: 2 week Additional clearance: none Tubal papers signed yes

## 2023-11-24 LAB — CBC
HCT: 41.8 % (ref 35.0–45.0)
Hemoglobin: 13.9 g/dL (ref 11.7–15.5)
MCH: 31.4 pg (ref 27.0–33.0)
MCHC: 33.3 g/dL (ref 32.0–36.0)
MCV: 94.4 fL (ref 80.0–100.0)
MPV: 10.5 fL (ref 7.5–12.5)
Platelets: 256 Thousand/uL (ref 140–400)
RBC: 4.43 Million/uL (ref 3.80–5.10)
RDW: 12.5 % (ref 11.0–15.0)
WBC: 7.5 Thousand/uL (ref 3.8–10.8)

## 2023-11-27 ENCOUNTER — Ambulatory Visit: Payer: Self-pay | Admitting: Obstetrics and Gynecology

## 2023-11-29 ENCOUNTER — Ambulatory Visit: Admitting: Physical Therapy

## 2023-12-26 ENCOUNTER — Ambulatory Visit: Admit: 2023-12-26 | Payer: Medicaid (Managed Care) | Attending: Radiation Oncology | Primary: Radiation Oncology

## 2023-12-26 DIAGNOSIS — L732 Hidradenitis suppurativa: Principal | ICD-10-CM

## 2023-12-26 NOTE — Progress Notes (Signed)
 RADIATION ONCOLOGY FOLLOW-UP VISIT NOTE     Encounter Date: 12/26/2023  Patient Name: Debbie Gregory  Medical Record Number: [id number]    DIAGNOSIS:  33yo with hidradenitis suppurativa with symptomatic lesions in the left groin and right axilla refractory to prior treatment s/p radiation (7.5Gy/3xf finished 11/2023)    DURATION SINCE COMPLETION OF RADIOTHERAPY:  6 weeks (11/14/2023)    ASSESSMENT:  Disease Status:   L groin:  partial response to radiation  R axilla: Minimal response to radiation    RECOMMENDATIONS:  FOLLOW-UP:  Dermatology (Dr. Paulita) in 3 months.  PRN follow-up with rad onc  Skin:  Mild hyperpigmentation from radiation- discussed expectations for improvement  HS:  She has had a nice response to radiation in the left groin (decreased flare frequency, intensity) but minimal response in the right axilla.  We discussed that we can consider additional radiation other sites of disease (or potentially re-treatment if radiation benefit is not durable)    INTERVAL HISTORY:  Overall doing well since finishing RT 6 weeks ago.  She did have one intense flare in the left groin a few days after radiation but since then she's found that the flares are less frequent and less intense than baseline.  She estimates she has flares ~2 weeks in the left groin (less frequent) and when they do occur they are more focal with less pain, drainage, etc.  She has not noticed much difference in terms of flares in the right axilla- in fact feels like she may be getting one at the time of our visit.  She has one other symptomatic area of the low abdomen (not treated with RT) that is starting to bother her more recently.  She had no other major issues post-RT aside from mild skin darkening in the area of the radiation.    REVIEW OF SYSTEMS:  A comprehensive review of 10 systems was negative except for pertinent positives noted in HPI.    PAST MEDICAL HISTORY/FAMILY HISTORY/SOCIAL HISTORY:  Reviewed in EPIC    ALLERGIES/MEDICATIONS:  Reviewed in EPIC    PHYSICAL EXAM:  Vital Signs for this encounter:   BP 119/71  - Pulse 80  - Temp 36.3 ??C (97.4 ??F) (Temporal)  - Resp 18  - Wt 73.3 kg (161 lb 8 oz)  - SpO2 100%  - BMI 29.54 kg/m??   Current Pain (Scale: 0 = no pain, 10 = extreme pain): 4  Karnofsky/Lansky Performance Status: 100, Fully active, able to carry on all pre-disease performed without restriction (ECOG equivalent 0)  General:   No acute distress, alert and oriented X 4   Psychiatric:  Normal mood and affect.  Converses clearly and emotionally appropriate.   HEENT:  EOMI, MMM  Cardio: RRR, no m/r/g  Respiratory: LCTAB, no w/r/r  Abdomen: Soft, non-tender, non-distended. Normal active bowel sounds  Neuro: CN III-XII intact grossly, AAOx3  Extremities:  No clubbing cyanosis, or edema  Skin:  In the right axilla there are 2 inflamed nodules without any draining sinuses noted.  Slight hyperpigmentation in the area of radiation.  In the left groin there 2 inflamed nodules but no draining sinus tracts.    RADIOLOGY:  None    Labs:    No results found for: WBC, HGB, HCT, PLT, LDH, CREATININE, AST, ALT, MG    Franky Sprinkles, MD  Assistant Professor  [facility] Dept of Radiation Oncology  12/26/2023

## 2023-12-31 NOTE — Progress Notes (Signed)
 [facility] Refill Coordination Note    Patient reports doing well on the medication and did not have any questions or concerns at this time.   Patient is due for week 10 dose this Friday 01/04/24  We will send week 12.14.16 in this delivery for 56 day supply   Patient is aware at week 16 to go to monthly    Specialty Medication(s) to be Shipped:   Inflammatory Disorders: Bimzelx     Other medication(s) to be shipped: No additional medications requested for fill at this time    Specialty Medications not needed at this time: N/A     Debbie Gregory, DOB: 03-25-1990  Phone: [phone] (home)       All above HIPAA information was verified with patient.     Was a nurse, learning disability used for this call? No    Completed refill call assessment today to schedule patient's medication shipment from the [facility]  [phone]).  All relevant notes have been reviewed.     Specialty medication(s) and dose(s) confirmed: Regimen is correct and unchanged.   Changes to medications: Mariabelen reports no changes at this time.  Changes to insurance: No  New side effects reported not previously addressed with a pharmacist or physician: None reported  Questions for the pharmacist: No    Confirmed patient received a Conservation Officer, Historic Buildings and a Surveyor, Mining with first shipment. The patient will receive a drug information handout for each medication shipped and additional FDA Medication Guides as required.       DISEASE/MEDICATION-SPECIFIC INFORMATION        N/A    SPECIALTY MEDICATION ADHERENCE     Medication Adherence    Patient reported X missed doses in the last month: 0  Specialty Medication: bimekizumab -bkzx (BIMZELX ) 320 mg/2 mL subcutaneous injection  Informant: patient              Were doses missed due to medication being on hold? No    Bimzlex 320 mg/ml: 1 doses of medicine on hand       Specialty medication is an injection or given on a cycle: Yes, Next injection is scheduled for 01/04/24.    REFERRAL TO PHARMACIST     Referral to the pharmacist: Not needed      [facility]     Shipping address confirmed in Epic.     Cost and Payment: Patient has a $0 copay, payment information is not required.    Delivery Scheduled: Yes, Expected medication delivery date: 01/02/24.   Medication will be delivered via UPS to the prescription address in Epic WAM.    Burnard DELENA Neighbors, PharmD   [facility]  Specialty Pharmacist

## 2024-01-01 MED FILL — BIMZELX 320 MG/2 ML SUBCUTANEOUS SYRINGE: SUBCUTANEOUS | 56 days supply | Qty: 8 | Fill #2

## 2024-01-09 MED ORDER — CLOTRIMAZOLE-BETAMETHASONE 1 %-0.05 % TOPICAL CREAM
Freq: Two times a day (BID) | TOPICAL | 2 refills | 0.00000 days | Status: CP
Start: 2024-01-09 — End: 2025-01-08

## 2024-01-21 DIAGNOSIS — L732 Hidradenitis suppurativa: Principal | ICD-10-CM

## 2024-01-21 MED ORDER — BIMZELX 320 MG/2 ML SUBCUTANEOUS SYRINGE
4 refills | 0.00000 days
Start: 2024-01-21 — End: ?

## 2024-01-25 MED ORDER — BIMZELX AUTOINJECTOR 320 MG/2 ML SUBCUTANEOUS AUTO-INJECTOR
SUBCUTANEOUS | 11 refills | 0.00000 days | Status: CP
Start: 2024-01-25 — End: ?

## 2024-02-11 NOTE — Progress Notes (Signed)
 I spoke with Rosina - She has 2 doses left. 1 syringe for week 16 dose on 2/6, then first maintenance dose due on 3/6. She'll be due again 4 weeks later on 4/3.    Will adjust call accordingly.     Shandreka Dante A. Claudene, PharmD, BCPS - Clinical Pharmacist   [facility]    [address] [place], [place]  [address]  t [phone], opt 4 then 2 - f [phone]
